# Patient Record
Sex: Female | Born: 1960 | Race: Black or African American | Hispanic: No | Marital: Married | Smoking: Current every day smoker
Health system: Southern US, Community
[De-identification: ages and names within clinical notes are randomized; demographics above are authoritative.]

## PROBLEM LIST (undated history)

## (undated) DIAGNOSIS — D649 Anemia, unspecified: Secondary | ICD-10-CM

## (undated) DIAGNOSIS — E119 Type 2 diabetes mellitus without complications: Secondary | ICD-10-CM

## (undated) DIAGNOSIS — M1990 Unspecified osteoarthritis, unspecified site: Secondary | ICD-10-CM

## (undated) DIAGNOSIS — M797 Fibromyalgia: Secondary | ICD-10-CM

## (undated) DIAGNOSIS — F418 Other specified anxiety disorders: Secondary | ICD-10-CM

## (undated) DIAGNOSIS — M726 Necrotizing fasciitis: Secondary | ICD-10-CM

## (undated) DIAGNOSIS — I10 Essential (primary) hypertension: Secondary | ICD-10-CM

## (undated) HISTORY — PX: OTHER SURGICAL HISTORY: SHX169

---

## 2015-08-17 ENCOUNTER — Encounter (HOSPITAL_COMMUNITY): Payer: Self-pay

## 2015-08-17 ENCOUNTER — Observation Stay (HOSPITAL_COMMUNITY)
Admission: EM | Admit: 2015-08-17 | Discharge: 2015-08-18 | Disposition: A | Discharge status: Home / Self Care | Payer: PRIVATE HEALTH INSURANCE | Attending: Family Medicine | Admitting: Family Medicine

## 2015-08-17 DIAGNOSIS — R0602 Shortness of breath: Secondary | ICD-10-CM | POA: Diagnosis present

## 2015-08-17 DIAGNOSIS — E119 Type 2 diabetes mellitus without complications: Secondary | ICD-10-CM | POA: Insufficient documentation

## 2015-08-17 DIAGNOSIS — N19 Unspecified kidney failure: Secondary | ICD-10-CM | POA: Insufficient documentation

## 2015-08-17 DIAGNOSIS — Z7982 Long term (current) use of aspirin: Secondary | ICD-10-CM | POA: Insufficient documentation

## 2015-08-17 DIAGNOSIS — I10 Essential (primary) hypertension: Secondary | ICD-10-CM | POA: Insufficient documentation

## 2015-08-17 DIAGNOSIS — Z79899 Other long term (current) drug therapy: Secondary | ICD-10-CM | POA: Diagnosis not present

## 2015-08-17 DIAGNOSIS — R4182 Altered mental status, unspecified: Secondary | ICD-10-CM | POA: Diagnosis present

## 2015-08-17 DIAGNOSIS — M65052 Abscess of tendon sheath, left thigh: Secondary | ICD-10-CM | POA: Diagnosis not present

## 2015-08-17 DIAGNOSIS — IMO0002 Reserved for concepts with insufficient information to code with codable children: Secondary | ICD-10-CM

## 2015-08-17 HISTORY — DX: Necrotizing fasciitis: M72.6

## 2015-08-17 HISTORY — DX: Essential (primary) hypertension: I10

## 2015-08-17 HISTORY — DX: Type 2 diabetes mellitus without complications: E11.9

## 2015-08-17 NOTE — ED Triage Notes (Signed)
Pt arrives via [facility] for altered mental status.  Pt apparently was just discharged from [facility] with necrotizing fasciitis.  Pt has an open wound to the inside of her left upper thigh.

## 2015-08-18 ENCOUNTER — Observation Stay (HOSPITAL_COMMUNITY): Payer: PRIVATE HEALTH INSURANCE

## 2015-08-18 ENCOUNTER — Encounter (HOSPITAL_COMMUNITY): Payer: Self-pay

## 2015-08-18 ENCOUNTER — Emergency Department (HOSPITAL_COMMUNITY): Payer: PRIVATE HEALTH INSURANCE

## 2015-08-18 DIAGNOSIS — R0602 Shortness of breath: Secondary | ICD-10-CM | POA: Diagnosis not present

## 2015-08-18 LAB — CBC WITH DIFFERENTIAL/PLATELET
Basophils Absolute: 0.2 10*3/uL — ABNORMAL HIGH (ref 0.0–0.1)
Basophils Relative: 1 %
EOS ABS: 0.3 10*3/uL (ref 0.0–0.7)
Eosinophils Relative: 2 %
HEMATOCRIT: 29.8 % — AB (ref 36.0–46.0)
HEMOGLOBIN: 9.9 g/dL — AB (ref 12.0–15.0)
LYMPHS ABS: 2 10*3/uL (ref 0.7–4.0)
Lymphocytes Relative: 16 %
MCH: 29.6 pg (ref 26.0–34.0)
MCHC: 33.2 g/dL (ref 30.0–36.0)
MCV: 89 fL (ref 78.0–100.0)
MONOS PCT: 9 %
Monocytes Absolute: 1.2 10*3/uL — ABNORMAL HIGH (ref 0.1–1.0)
NEUTROS ABS: 9.1 10*3/uL — AB (ref 1.7–7.7)
NEUTROS PCT: 72 %
Platelets: 367 10*3/uL (ref 150–400)
RBC: 3.35 MIL/uL — ABNORMAL LOW (ref 3.87–5.11)
RDW: 15.4 % (ref 11.5–15.5)
WBC: 12.8 10*3/uL — ABNORMAL HIGH (ref 4.0–10.5)

## 2015-08-18 LAB — URINALYSIS, ROUTINE W REFLEX MICROSCOPIC
BILIRUBIN URINE: NEGATIVE
Glucose, UA: 100 mg/dL — AB
KETONES UR: NEGATIVE mg/dL
LEUKOCYTES UA: NEGATIVE
NITRITE: NEGATIVE
Protein, ur: 30 mg/dL — AB
Specific Gravity, Urine: 1.01 (ref 1.005–1.030)
pH: 7 (ref 5.0–8.0)

## 2015-08-18 LAB — COMPREHENSIVE METABOLIC PANEL
ALK PHOS: 86 U/L (ref 38–126)
ALT: 14 U/L (ref 14–54)
ANION GAP: 9 (ref 5–15)
AST: 17 U/L (ref 15–41)
Albumin: 3 g/dL — ABNORMAL LOW (ref 3.5–5.0)
BILIRUBIN TOTAL: 0.8 mg/dL (ref 0.3–1.2)
BUN: 25 mg/dL — ABNORMAL HIGH (ref 6–20)
CALCIUM: 8.1 mg/dL — AB (ref 8.9–10.3)
CO2: 25 mmol/L (ref 22–32)
Chloride: 109 mmol/L (ref 101–111)
Creatinine, Ser: 3.83 mg/dL — ABNORMAL HIGH (ref 0.44–1.00)
GFR, EST AFRICAN AMERICAN: 15 mL/min — AB (ref >60)
GFR, EST NON AFRICAN AMERICAN: 13 mL/min — AB (ref >60)
Glucose, Bld: 212 mg/dL — ABNORMAL HIGH (ref 65–99)
Potassium: 3 mmol/L — ABNORMAL LOW (ref 3.5–5.1)
Sodium: 143 mmol/L (ref 135–145)
TOTAL PROTEIN: 7 g/dL (ref 6.5–8.1)

## 2015-08-18 LAB — URINE MICROSCOPIC-ADD ON

## 2015-08-18 LAB — BRAIN NATRIURETIC PEPTIDE: B Natriuretic Peptide: 358 pg/mL — ABNORMAL HIGH (ref 0.0–100.0)

## 2015-08-18 LAB — TROPONIN I: Troponin I: <0.03 ng/mL (ref <0.03)

## 2015-08-18 LAB — I-STAT CG4 LACTIC ACID, ED: Lactic Acid, Venous: 1.17 mmol/L (ref 0.5–1.9)

## 2015-08-18 MED ORDER — SODIUM CHLORIDE 0.9 % IV BOLUS (SEPSIS)
1000.0000 mL | Freq: Once | INTRAVENOUS | Status: AC
  Administered 2015-08-18: 1000 mL via INTRAVENOUS

## 2015-08-18 MED ORDER — ACETAMINOPHEN 325 MG PO TABS
650.0000 mg | ORAL_TABLET | Freq: Four times a day (QID) | ORAL | Status: DC | PRN
  Administered 2015-08-18: 650 mg via ORAL
  Filled 2015-08-18: qty 2

## 2015-08-18 MED ORDER — TECHNETIUM TC 99M DIETHYLENETRIAME-PENTAACETIC ACID
30.0000 | Freq: Once | INTRAVENOUS | Status: AC | PRN
  Administered 2015-08-18: 30 via RESPIRATORY_TRACT

## 2015-08-18 MED ORDER — POTASSIUM CHLORIDE CRYS ER 20 MEQ PO TBCR
40.0000 meq | EXTENDED_RELEASE_TABLET | Freq: Once | ORAL | Status: AC
  Administered 2015-08-18: 40 meq via ORAL
  Filled 2015-08-18: qty 2

## 2015-08-18 MED ORDER — PAROXETINE HCL 20 MG PO TABS: 20.0000 mg | ORAL_TABLET | ORAL | Status: DC

## 2015-08-18 MED ORDER — TECHNETIUM TO 99M ALBUMIN AGGREGATED
4.0000 | Freq: Once | INTRAVENOUS | Status: AC | PRN
  Administered 2015-08-18: 4 via INTRAVENOUS

## 2015-08-18 NOTE — Progress Notes (Signed)
Arrived to 330, NAD noted. Patient alert to self and place. vss family at bedside.

## 2015-08-18 NOTE — ED Provider Notes (Signed)
[facility]-EMERGENCY DEPT Provider Note   CSN: [id number] Arrival date & time: 08/17/15  2353  First Provider Contact:   First MD Initiated Contact with Patient 08/18/15 0001    By signing my name below, I, [facility], attest that this documentation has been prepared under the direction and in the presence of Cindy Brooking, MD . Electronically Signed: Levon Ayers, Scribe. 08/18/2015. 12:42 AM.   History   Chief Complaint Chief Complaint  Patient presents with  . Altered Mental Status    HPI Cindy Ayers is a 55 y.o. female brought in by ambulance with PMHx of DM, HTN and necrotizing fasciitis who presents to the Emergency Department complaining of rapidly improving, sudden onset shortness of breath. Pt was given supplemental oxygen on the ambulance PTA with relief. Pt denies any SOB during exam. Pt was discharged from [facility] recently with necrotizing fasciitis and has open wound to inside of left thigh. She has Rx for abx dated 08/16/15 which have not been filled. She denies abdominal pain, chest pain, vomiting, diarrhea, fever, headache. She denies any PMHx of blood clots or SOB.  The history is provided by the patient and medical records. No language interpreter was used.    Past Medical History:  Diagnosis Date  . Diabetes mellitus without complication (HCC)   . Hypertension   . Necrotizing fasciitis [facility])     Patient Active Problem List   Diagnosis Date Noted  . SOB (shortness of breath) 08/18/2015    Past Surgical History:  Procedure Laterality Date  . incision and drainage left thigh Left     OB History    No data available       Home Medications    Prior to Admission medications   Medication Sig Start Date End Date Taking? Authorizing Provider  amLODipine (NORVASC) 10 MG tablet Take 10 mg by mouth daily.   Yes Historical Provider, MD  aspirin EC 81 MG tablet Take 81 mg by mouth daily.   Yes Historical Provider, MD  atorvastatin (LIPITOR) 10 MG  tablet Take 10 mg by mouth daily.   Yes Historical Provider, MD  CALCIUM PO Take 1 tablet by mouth 2 (two) times daily.   Yes Historical Provider, MD  carvedilol (COREG) 25 MG tablet Take 25 mg by mouth 2 (two) times daily with a meal.   Yes Historical Provider, MD  furosemide (LASIX) 20 MG tablet Take 20 mg by mouth.   Yes Historical Provider, MD  HYDROcodone-acetaminophen (NORCO/VICODIN) 5-325 MG tablet Take 1 tablet by mouth every 4 (four) hours as needed for moderate pain.   Yes Historical Provider, MD  levofloxacin (LEVAQUIN) 500 MG tablet Take 500 mg by mouth every other day. 08/16/15 09/03/15 Yes Historical Provider, MD  losartan (COZAAR) 100 MG tablet Take 100 mg by mouth daily.   Yes Historical Provider, MD  metroNIDAZOLE (FLAGYL) 500 MG tablet Take 500 mg by mouth 3 (three) times daily. 08/16/15 08/21/15 Yes Historical Provider, MD  PARoxetine (PAXIL) 20 MG tablet Take 20 mg by mouth daily.   Yes Historical Provider, MD    Family History Family History  Problem Relation Age of Onset  . Diabetes Sister     Social History Social History  Substance Use Topics  . Smoking status: Never Smoker  . Smokeless tobacco: Never Used  . Alcohol use No     Allergies   Antihistamines, chlorpheniramine-type and Ethylenediamine   Review of Systems Review of Systems 10 Systems reviewed and are negative for acute change  except as noted in the HPI.  Physical Exam Updated Vital Signs BP (!) 178/73 (BP Location: Left Arm)   Pulse 71   Temp 98.1 F (36.7 C) (Oral)   Resp 20   Ht  (1.676 m)   Wt 285 lb 4.4 oz (129.4 kg)   SpO2 96%   BMI 46.04 kg/m   Physical Exam  Constitutional: She is oriented to person, place, and time. She appears well-developed and well-nourished. No distress.  Awake and alert.  Denies SOB.  Obese.  HENT:  Head: Normocephalic and atraumatic.  Mouth/Throat: Oropharynx is clear and moist. No oropharyngeal exudate.  Eyes: Conjunctivae and EOM are normal.  Pupils are equal, round, and reactive to light.  Neck: Normal range of motion. Neck supple.  No meningismus.  Cardiovascular: Normal rate, regular rhythm, normal heart sounds and intact distal pulses.   No murmur heard. Pulmonary/Chest: Effort normal and breath sounds normal. No respiratory distress. She has no wheezes. She has no rales.  Lungs are clear. No wheezing.  Abdominal: Soft. There is no tenderness. There is no rebound and no guarding.  Musculoskeletal: Normal range of motion. She exhibits no edema or tenderness.  Neurological: She is alert and oriented to person, place, and time. No cranial nerve deficit. She exhibits normal muscle tone. Coordination normal.   5/5 strength throughout. CN 2-12 intact.Equal grip strength.   Skin: Skin is warm.  Large wound to left medial thigh  Clean based as depicted No bleeding or drainage   Psychiatric: She has a normal mood and affect. Her behavior is normal.  Nursing note and vitals reviewed.      ED Treatments / Results  Labs (all labs ordered are listed, but only abnormal results are displayed) Labs Reviewed  CBC WITH DIFFERENTIAL/PLATELET - Abnormal; Notable for the following:       Result Value   WBC 12.8 (*)    RBC 3.35 (*)    Hemoglobin 9.9 (*)    HCT 29.8 (*)    Neutro Abs 9.1 (*)    Monocytes Absolute 1.2 (*)    Basophils Absolute 0.2 (*)    All other components within normal limits  COMPREHENSIVE METABOLIC PANEL - Abnormal; Notable for the following:    Potassium 3.0 (*)    Glucose, Bld 212 (*)    BUN 25 (*)    Creatinine, Ser 3.83 (*)    Calcium 8.1 (*)    Albumin 3.0 (*)    GFR calc non Af Amer 13 (*)    GFR calc Af Amer 15 (*)    All other components within normal limits  URINALYSIS, ROUTINE W REFLEX MICROSCOPIC (NOT AT [facility]) - Abnormal; Notable for the following:    Glucose, UA 100 (*)    Hgb urine dipstick SMALL (*)    Protein, ur 30 (*)    All other components within normal limits  URINE MICROSCOPIC-ADD  ON - Abnormal; Notable for the following:    Squamous Epithelial / LPF 0-5 (*)    Bacteria, UA FEW (*)    All other components within normal limits  BRAIN NATRIURETIC PEPTIDE - Abnormal; Notable for the following:    B Natriuretic Peptide 358.0 (*)    All other components within normal limits  CULTURE, BLOOD (ROUTINE X 2)  CULTURE, BLOOD (ROUTINE X 2)  URINE CULTURE  TROPONIN I  I-STAT CG4 LACTIC ACID, ED    EKG  EKG Interpretation None       Radiology Ct Head Wo Contrast  Result Date: 08/18/2015 CLINICAL DATA:  55 year old female with altered mental status EXAM: CT HEAD WITHOUT CONTRAST TECHNIQUE: Contiguous axial images were obtained from the base of the skull through the vertex without intravenous contrast. COMPARISON:  None. FINDINGS: The ventricles and sulci appropriate in size for patient's age. Minimal periventricular and deep white matter chronic microvascular ischemic changes noted. There is no acute intracranial hemorrhage. No mass effect or midline shift. The visualized paranasal sinuses and mastoid air cells are clear. The calvarium is intact. IMPRESSION: No acute intracranial pathology. Electronically Signed   By: Elgie Collard M.D.   On: 08/18/2015 04:44   Ct Femur Left Wo Contrast  Result Date: 08/18/2015 CLINICAL DATA:  55 year old female with history of diabetes and necrotizing fasciitis presenting with open wound on the inside of the left thigh. EXAM: CT OF THE LEFT FEMUR WITHOUT CONTRAST TECHNIQUE: Multidetector CT imaging was performed according to the standard protocol. Multiplanar CT image reconstructions were also generated. COMPARISON:  None. FINDINGS: Evaluation of this exam is limited in the absence of intravenous contrast. Evaluation is also very limited due to streak artifact caused by patient's body habitus. No definite acute fracture or dislocation. There is a skin defect and open wound in the superficial soft tissues of the medial aspect of the left thigh  in. No large drainable fluid collection or soft tissue gas identified. IMPRESSION: Very limited study due to patient's body habitus. Open wound in the medial aspect of the left thigh. No definite drainable fluid collection or soft tissue gas. Electronically Signed   By: Elgie Collard M.D.   On: 08/18/2015 05:36   Nm Pulmonary Perf And Vent  Result Date: 08/18/2015 CLINICAL DATA:  Shortness of breath for 1 day EXAM: NUCLEAR MEDICINE VENTILATION - PERFUSION LUNG SCAN Views: Anterior, posterior, left lateral, right lateral, RPO, LPO, RAO, LAO -ventilation and perfusion RADIOPHARMACEUTICALS:  30.0 mCi Technetium-1m DTPA aerosol inhalation and 4.9 mCi Technetium-29m MAA IV COMPARISON:  Chest radiograph August 18, 2015 FINDINGS: Ventilation: Radiotracer uptake is homogeneous and symmetric bilaterally. No ventilation defects are evident. Perfusion: Radiotracer uptake is homogeneous and symmetric bilaterally. No perfusion defects are evident. IMPRESSION: Normal study without appreciable ventilation or perfusion defects. Electronically Signed   By: Bretta Bang III M.D.   On: 08/18/2015 09:57   Dg Chest Portable 1 View  Result Date: 08/18/2015 CLINICAL DATA:  Altered mental status. Shortness of breath tonight. Patient recently discharged from an outside facility with necrotizing fasciitis. EXAM: PORTABLE CHEST 1 VIEW COMPARISON:  None. FINDINGS: Mild elevation of right hemidiaphragm. Mild enlargement of the cardiac silhouette. There is aortic atherosclerosis. Probable vascular congestion without evidence of pulmonary edema. Bibasilar atelectasis. No focal airspace disease. No large pleural effusion or pneumothorax. Evaluation limited by portable technique and large body habitus. IMPRESSION: 1. Cardiomegaly with aortic atherosclerosis. Probable vascular congestion. 2. Elevated right hemidiaphragm.  Bibasilar atelectasis. Electronically Signed   By: Rubye Oaks M.D.   On: 08/18/2015 00:32     Procedures Procedures (including critical care time)  Medications Ordered in ED Medications  acetaminophen (TYLENOL) tablet 650 mg (650 mg Oral Given 08/18/15 1419)  potassium chloride SA (K-DUR,KLOR-CON) CR tablet 40 mEq (not administered)  sodium chloride 0.9 % bolus 1,000 mL (0 mLs Intravenous Stopped 08/18/15 0638)  technetium TC 56M diethylenetriame-pentaacetic acid (DTPA) injection 30 millicurie (30 millicuries Inhalation Given 08/18/15 0900)  technetium albumin aggregated (MAA) injection solution 4 millicurie (4 millicuries Intravenous Contrast Given 08/18/15 0920)     Initial Impression / Assessment and Plan /  ED Course  I have reviewed the triage vital signs and the nursing notes.  Pertinent labs & imaging results that were available during my care of the patient were reviewed by me and considered in my medical decision making (see chart for details).  Clinical Course  Patient from home with shortness of breath that has since resolved. Discharged from the hospital today after apparent stay for necrotizing fasciitis of her left thigh. Denies fever, chills, nausea or vomiting. She is alert and oriented 3 and in no distress on arrival. Denies chest pain or shortness of breath.  Labs show anemia with no comparison. An elevated creatinine 3.8 with no comparison.  Records received from [place]. Patient was taken to the operating room in July 15 for incision and drainage of her left upper thigh abscess and necrotizing fasciitis. She also went to the OR on July 17. And on July 19. She was intubated due to her sepsis. She was also on dialysis while she was hospitalized and found to have depressive disorder and was refusing care.  Patient's husband has arrived. Contrary to triage note he denies any altered mental status. States patient was complaining of shortness of breath only. States she feels better now. No fever. They're concerned because there were not given any wound care supplies for  her leg. There is no tachycardia and no hypoxia. Patient denies any chest pain or shortness of breath currently. Chest x-rays negative. Patient's creatinine precludes CT angiogram but there is some concern for possible pulmonary embolism given her recent hospitalization and surgeries.  Plan observation to obtain VQ scan in the morning.  Patient states she feels back to baseline. No CP or SOB. CXR shows cardiomegaly. BNP minimally elevated. Troponin negative. No tachycardia or hypoxia.  Creatinine elevated but unclear what discharge creatinine was.  D/w Dr. Onalee Hua and Dr. Irene Limbo.  Final Clinical Impressions(s) / ED Diagnoses   Final diagnoses:  Wound abscess  Shortness of breath  Renal failure   I personally performed the services described in this documentation, which was scribed in my presence. The recorded information has been reviewed and is accurate.   New Prescriptions Current Discharge Medication List       Glynn Octave, MD 08/18/15 [phone]

## 2015-08-18 NOTE — Progress Notes (Signed)
Inpatient Diabetes Program Recommendations  AACE/ADA: New Consensus Statement on Inpatient Glycemic Control (2015)  Target Ranges:  Prepandial:   less than 140 mg/dL      Peak postprandial:   less than 180 mg/dL (1-2 hours)      Critically ill patients:  140 - 180 mg/dL   Results for [facility], [facility] (MRN [id number]) as of 08/18/2015 09:09  Ref. Range 08/18/2015 00:20  Glucose Latest Ref Range: 65 - 99 mg/dL 427 (H)   Review of Glycemic Control  Diabetes history: DM2 Outpatient Diabetes medications: None listed on home medication list Current orders for Inpatient glycemic control: None  Inpatient Diabetes Program Recommendations: Correction (SSI): While inpatient, please order CBGs with Novolog correction scale ACHS. HgbA1C: Please order an A1C to evaluate glycemic control over the past 2-3 months. Diet: When diet ordered, please order Carb Modified diet.  Thanks, Orlando Penner, RN, MSN, CDE Diabetes Coordinator Inpatient Diabetes Program [phone] (Team Pager from 8am to 5pm) [phone] ([facility] office) [phone] [facility] office) [phone] [facility] office)

## 2015-08-18 NOTE — Care Management Note (Signed)
Case Management Note  Patient Details  Name: Cindy Ayers MRN: [id number] Date of Birth: 08/14/1960  Subjective/Objective: Patient is from home, ind with ADL's. Recently discharged from [facility], apparently has no home health services in place. Will need HHRN/PT.              Action/Plan: Will DC today with HH. [facility] notified and will obtain orders from chart. Patient and husband aware [facility] has 48 hours to make first visit.   Expected Discharge Date:  08/20/15               Expected Discharge Plan:  Home w Home Health Services  In-House Referral:  NA  Discharge planning Services  CM Consult  Post Acute Care Choice:  NA Choice offered to:  NA  DME Arranged:    DME Agency:     HH Arranged:    HH Agency:     Status of Service:  Completed, signed off  If discussed at [employer] of Stay Meetings, dates discussed:    Additional Comments:  Edris Schneck, Chrystine Oiler, RN 08/18/2015, 1:13 PM

## 2015-08-18 NOTE — Progress Notes (Signed)
Pt discharged home today per Dr. Irene Limbo.  Pt's IV site D/C'd and WDL.  Pt's VSS.  Pt provided with home medication list and discharge instructions.  Verbalized understanding.  Husband return demonstrated how to empty foley catheter.  Pt left floor via WC in stable condition accompanied by NT.

## 2015-08-18 NOTE — Discharge Summary (Signed)
Physician Discharge Summary  Cindy Ayers ZOX:[id number] DOB: 11-10-1960 DOA: 08/17/2015  PCP: [facility]  Admit date: 08/17/2015 Discharge date: 08/18/2015  Recommendations for Outpatient Follow-up:  1. Follow up with PCP, General surgery and nephrology as previously arranged by discharging physician at [facility] 2. Home health has been arranged for wound and foley care  Discharge Diagnoses:  1. SOB 2. AKI  3. Hypokalemia 4. Anemia of critical illness 5. Necrotizing fasciitis on previous admission 6. DM type 2 7. Morbid obesity  Discharge Condition: improved Disposition: discharge home with [facility]  Diet recommendation: carb modified  Filed Weights   08/18/15 0216 08/18/15 0703  Weight: 124.7 kg (275 lb) 129.4 kg (285 lb 4.4 oz)    History of present illness:  55 year old woman discharged from [facility] August 1 after 2 week hospitalization for necrotizing fasciitis of the left thigh requiring multiple debridement, ventilatory dependent respiratory failure, sepsis, acute kidney injury requiring temporary dialysis who presented to the emergency department at [facility] 8/2 with shortness of breath. Initial evaluation was unrevealing given recent hospitalization there was concern for PE, observation was requested until such time as a VQ scan could be obtained.  Hospital Course:  Patient was observed in the hospital. Subsequent VQ scan was normal. She's had no recurrent shortness of breath. No hypoxia. No leukocytosis. Chest x-ray no acute disease. Troponin was negative. EKG nonacute. Etiology of shortness of breath is unclear but there is no evidence of infection or life-threatening issue.Consider anxiety given known depression. Review of records demonstrated improving renal function and hemoglobin. She's had no concerning features signs or symptoms during her observation. Home health was arranged for proper wound care and follow-up in the outpatient  setting. Foley catheter was placed in the emergency department. Husband does report patient has been using a bedpan and has difficulty keeping wound clean. Wound has been contaminant by urine at home. Therefore the patient and the husband elect to keep Foley catheter for now to aid in wound care. They understand the benefits and risks including infection. Individual issues as below.  1. SOB. Spontaneously resolved. No chest pain. Well's score 1.5 (low risk, recent surgery). CXR and CT head negative. BNP with trivial elevation. VQ scan was normal. 2. AKI. Improving based on review of records from hospitalization in [place], specifically on discharge summary where BUN was noted to be 30 and creatinine was noted to be 4.4 on discharge. She has outpatient follow-up with her physicians in [place]. 3. Hypokalemia. Repleted. 4. Anemia of critical illness, improving compared to discharge hemoglobin value of 8.9 5. Necrotizing fasciitis s/p OR 7/15 and 7/17 [facility] with open wound left thigh d/c from [facility] 8/2. Hospitalization complicated by acute dialysis, depressive disorder refusing care. CT femur 8/3 at [facility]: no gas or fluid collection. 6. RN documented AMS but EDP reported no AMS. No altered mental status per family. 7. HTN.. Stable. 8. DM. Blood sugars stable. 9. Morbid obesity  Discharge Instructions  Discharge Instructions    Diet Carb Modified    Complete by:  As directed   Discharge instructions    Complete by:  As directed   Call your physician or seek immediate medical attention for fever, pain, wound drainage, rash, shortness of breath or worsening of condition.   Increase activity slowly    Complete by:  As directed       Medication List    STOP taking these medications   losartan 100 MG tablet Commonly known as:  COZAAR     TAKE these medications   amLODipine 10 MG tablet Commonly known as:  NORVASC Take 10 mg by mouth daily.   aspirin EC 81 MG  tablet Take 81 mg by mouth daily.   atorvastatin 10 MG tablet Commonly known as:  LIPITOR Take 10 mg by mouth daily.   CALCIUM PO Take 1 tablet by mouth 2 (two) times daily.   carvedilol 25 MG tablet Commonly known as:  COREG Take 25 mg by mouth 2 (two) times daily with a meal.   furosemide 20 MG tablet Commonly known as:  LASIX Take 20 mg by mouth.   HYDROcodone-acetaminophen 5-325 MG tablet Commonly known as:  NORCO/VICODIN Take 1 tablet by mouth every 4 (four) hours as needed for moderate pain.   levofloxacin 500 MG tablet Commonly known as:  LEVAQUIN Take 500 mg by mouth every other day.   metroNIDAZOLE 500 MG tablet Commonly known as:  FLAGYL Take 500 mg by mouth 3 (three) times daily.   PARoxetine 20 MG tablet Commonly known as:  PAXIL Take 20 mg by mouth daily. What changed:  Another medication with the same name was removed. Continue taking this medication, and follow the directions you see here.      Allergies  Allergen Reactions  . Antihistamines, Chlorpheniramine-Type   . Ethylenediamine     The results of significant diagnostics from this hospitalization (including imaging, microbiology, ancillary and laboratory) are listed below for reference.    Significant Diagnostic Studies: Ct Head Wo Contrast  Result Date: 08/18/2015 CLINICAL DATA:  55 year old female with altered mental status EXAM: CT HEAD WITHOUT CONTRAST TECHNIQUE: Contiguous axial images were obtained from the base of the skull through the vertex without intravenous contrast. COMPARISON:  None. FINDINGS: The ventricles and sulci appropriate in size for patient's age. Minimal periventricular and deep white matter chronic microvascular ischemic changes noted. There is no acute intracranial hemorrhage. No mass effect or midline shift. The visualized paranasal sinuses and mastoid air cells are clear. The calvarium is intact. IMPRESSION: No acute intracranial pathology. Electronically Signed   By:  Elgie Collard M.D.   On: 08/18/2015 04:44   Ct Femur Left Wo Contrast  Result Date: 08/18/2015 CLINICAL DATA:  55 year old female with history of diabetes and necrotizing fasciitis presenting with open wound on the inside of the left thigh. EXAM: CT OF THE LEFT FEMUR WITHOUT CONTRAST TECHNIQUE: Multidetector CT imaging was performed according to the standard protocol. Multiplanar CT image reconstructions were also generated. COMPARISON:  None. FINDINGS: Evaluation of this exam is limited in the absence of intravenous contrast. Evaluation is also very limited due to streak artifact caused by patient's body habitus. No definite acute fracture or dislocation. There is a skin defect and open wound in the superficial soft tissues of the medial aspect of the left thigh in. No large drainable fluid collection or soft tissue gas identified. IMPRESSION: Very limited study due to patient's body habitus. Open wound in the medial aspect of the left thigh. No definite drainable fluid collection or soft tissue gas. Electronically Signed   By: Elgie Collard M.D.   On: 08/18/2015 05:36   Nm Pulmonary Perf And Vent  Result Date: 08/18/2015 CLINICAL DATA:  Shortness of breath for 1 day EXAM: NUCLEAR MEDICINE VENTILATION - PERFUSION LUNG SCAN Views: Anterior, posterior, left lateral, right lateral, RPO, LPO, RAO, LAO -ventilation and perfusion RADIOPHARMACEUTICALS:  30.0 mCi Technetium-70m DTPA aerosol inhalation and 4.9 mCi Technetium-30m MAA IV COMPARISON:  Chest radiograph August 18, 2015 FINDINGS: Ventilation: Radiotracer uptake is homogeneous and symmetric bilaterally. No ventilation defects are evident. Perfusion: Radiotracer uptake is homogeneous and symmetric bilaterally. No perfusion defects are evident. IMPRESSION: Normal study without appreciable ventilation or perfusion defects. Electronically Signed   By: Bretta Bang III M.D.   On: 08/18/2015 09:57   Dg Chest Portable 1 View  Result Date:  08/18/2015 CLINICAL DATA:  Altered mental status. Shortness of breath tonight. Patient recently discharged from an outside facility with necrotizing fasciitis. EXAM: PORTABLE CHEST 1 VIEW COMPARISON:  None. FINDINGS: Mild elevation of right hemidiaphragm. Mild enlargement of the cardiac silhouette. There is aortic atherosclerosis. Probable vascular congestion without evidence of pulmonary edema. Bibasilar atelectasis. No focal airspace disease. No large pleural effusion or pneumothorax. Evaluation limited by portable technique and large body habitus. IMPRESSION: 1. Cardiomegaly with aortic atherosclerosis. Probable vascular congestion. 2. Elevated right hemidiaphragm.  Bibasilar atelectasis. Electronically Signed   By: Rubye Oaks M.D.   On: 08/18/2015 00:32    Microbiology: Recent Results (from the past 240 hour(s))  Blood culture (routine x 2)     Status: None (Preliminary result)   Collection Time: 08/18/15 12:20 AM  Result Value Ref Range Status   Specimen Description BLOOD LEFT ANTECUBITAL  Final   Special Requests BOTTLES DRAWN AEROBIC ONLY 3CC  Final   Culture NO GROWTH < 12 HOURS  Final   Report Status PENDING  Incomplete  Blood culture (routine x 2)     Status: None (Preliminary result)   Collection Time: 08/18/15 12:40 AM  Result Value Ref Range Status   Specimen Description BLOOD RIGHT HAND  Final   Special Requests   Final    BOTTLES DRAWN AEROBIC AND ANAEROBIC AEB 6CC ANA 4CC   Culture NO GROWTH < 12 HOURS  Final   Report Status PENDING  Incomplete     Labs: Basic Metabolic Panel:  Recent Labs Lab 08/18/15 0020  NA 143  K 3.0*  CL 109  CO2 25  GLUCOSE 212*  BUN 25*  CREATININE 3.83*  CALCIUM 8.1*   Liver Function Tests:  Recent Labs Lab 08/18/15 0020  AST 17  ALT 14  ALKPHOS 86  BILITOT 0.8  PROT 7.0  ALBUMIN 3.0*   CBC:  Recent Labs Lab 08/18/15 0020  WBC 12.8*  NEUTROABS 9.1*  HGB 9.9*  HCT 29.8*  MCV 89.0  PLT 367   Cardiac  Enzymes:  Recent Labs Lab 08/18/15 0942  TROPONINI <0.03     Recent Labs  08/18/15 0942  BNP 358.0*     Active Problems:   SOB (shortness of breath)   Time coordinating discharge: 55 minutes  Signed:  Brendia Sacks, MD [facility] 08/18/2015, 4:25 PM  By signing my name below, I, Adron Bene, attest that this documentation has been prepared under the direction and in the presence of Jisselle Poth P. Irene Limbo, MD. Electronically Signed: Adron Bene, Scribe.  08/18/15 12:00pm  I personally performed the services described in this documentation. All medical record entries made by the scribe were at my direction. I have reviewed the chart and agree that the record reflects my personal performance and is accurate and complete. Brendia Sacks, MD

## 2015-08-18 NOTE — H&P (Addendum)
History and Physical  Cindy Ayers ZOX:[id number] DOB: May 24, 1960 DOA: 08/17/2015  PCP: [facility]  Patient coming from: home  Chief Complaint: SOB  HPI:  55 year old woman with a hx of DM, HTN, and necrotizing fasciitis 07/2015 with discharged from [facility] 8/1 who presented with sudden onset of shortness of breath. Initial evaluation was unremarkable but given concern of recent hospitalization, observation was recommended to further evaluate obtain VQ scan.  She reports feeling fairly well when she went home. Last evening 8/2 she developed sudden shortness of breath. She reports that her symptoms were alleviated by sitting up. She described no aggravating factors. . She denied any nausea, vomiting, or diaphoresis. She reports that her breathing began to rapidly improve with supplemental oxygen given by EMS. By the time she arrived in the ED, she felt as though her breathing had returned to normal.   Patient was recently discharged from [facility] after being treated for necrotizing fasciitis. She was hospitalized for over two weeks, requiring two debridement surgeries, dialysis, and was discharged home with [facility].    While being evaluated in the ED, patient was noted to be in no acute distress with stable vital signs. Bloodwork revealed hypokalemia, elevated Cr, hyperglycemia, and leukocytosis. Her lactic acid was noted to be within normal limits. UA negative. Blood and urine cultures were collected and sent for further testing. CT femur showed an open wound on left thigh, but no definite drainable area. CT head was non-acute. Patient was referred for admission for further evaluation and management.  ED Course: afebrile, VSS, no hypoxia   Pertinent labs: BMP, CBC, lactic acid, UA, blood and urine cultures. Imaging: independently reviewed. CT head negative. CT femur no apparent complications. CXR vascular congestion.  Review of Systems:  Negative for  fever, visual changes, sore throat, rash, new muscle aches, chest pain, SOB, dysuria, bleeding, n/v/abdominal pain.  Past Medical History:  Diagnosis Date  . Diabetes mellitus without complication (HCC)   . Hypertension   . Necrotizing fasciitis [facility])     Past Surgical History:  Procedure Laterality Date  . incision and drainage left thigh Left      reports that she has never smoked. She has never used smokeless tobacco. She reports that she does not drink alcohol or use drugs. Ambulatory status: ambulatory.   Allergies  Allergen Reactions  . Antihistamines, Chlorpheniramine-Type   . Ethylenediamine     Family History  Problem Relation Age of Onset  . Diabetes Sister      Prior to Admission medications   Not on File    Physical Exam: Vitals:   08/18/15 0311 08/18/15 0330 08/18/15 0630 08/18/15 0703  BP: 181/83 177/79 178/84 (!) 165/78  Pulse: 90  72 78  Resp: 25 19 25  (!) 21  Temp:    98.7 F (37.1 C)  TempSrc:    Oral  SpO2: 96%  96% 98%  Weight:    129.4 kg (285 lb 4.4 oz)  Height:    5\' 6"  (1.676 m)   Constitutional:  . Appears calm and comfortable Eyes:  . PERRL and irises appear normal . Normal conjunctivae and lids ENMT:  . external ears, nose appear normal . grossly normal hearing . white exudate on buccal mucosa and tongue. Respiratory:  . CTA bilaterally, no w/r/r.  . Respiratory effort normal. No retractions or accessory muscle use Cardiovascular:  . RRR, no m/r/g . 1+ LE extremity edema left greater than right.   Abdomen:  .  Abdomen appears normal; no tenderness or masses. nondistended Musculoskeletal:  . Moves all extremities Skin:  . Grossly unremarkable except as documented by EDP Neurologic:  . Grossly normal Psychiatric:  . judgement and insight appear normal . Mental status o Mood, affect appropriate  Wt Readings from Last 3 Encounters:  08/18/15 129.4 kg (285 lb 4.4 oz)    I have personally reviewed following labs and  imaging studies  Labs on Admission:  CBC:  Recent Labs Lab 08/18/15 0020  WBC 12.8*  NEUTROABS 9.1*  HGB 9.9*  HCT 29.8*  MCV 89.0  PLT 367   Basic Metabolic Panel:  Recent Labs Lab 08/18/15 0020  NA 143  K 3.0*  CL 109  CO2 25  GLUCOSE 212*  BUN 25*  CREATININE 3.83*  CALCIUM 8.1*   Liver Function Tests:  Recent Labs Lab 08/18/15 0020  AST 17  ALT 14  ALKPHOS 86  BILITOT 0.8  PROT 7.0  ALBUMIN 3.0*   Urine analysis:    Component Value Date/Time   COLORURINE YELLOW 08/18/2015 0305   APPEARANCEUR CLEAR 08/18/2015 0305   LABSPEC 1.010 08/18/2015 0305   PHURINE 7.0 08/18/2015 0305   GLUCOSEU 100 (A) 08/18/2015 0305   HGBUR SMALL (A) 08/18/2015 0305   BILIRUBINUR NEGATIVE 08/18/2015 0305   KETONESUR NEGATIVE 08/18/2015 0305   PROTEINUR 30 (A) 08/18/2015 0305   NITRITE NEGATIVE 08/18/2015 0305   LEUKOCYTESUR NEGATIVE 08/18/2015 0305    Recent Results (from the past 240 hour(s))  Blood culture (routine x 2)     Status: None (Preliminary result)   Collection Time: 08/18/15 12:20 AM  Result Value Ref Range Status   Specimen Description BLOOD LEFT ANTECUBITAL  Final   Special Requests BOTTLES DRAWN AEROBIC ONLY 3CC  Final   Culture NO GROWTH < 12 HOURS  Final   Report Status PENDING  Incomplete  Blood culture (routine x 2)     Status: None (Preliminary result)   Collection Time: 08/18/15 12:40 AM  Result Value Ref Range Status   Specimen Description BLOOD RIGHT HAND  Final   Special Requests   Final    BOTTLES DRAWN AEROBIC AND ANAEROBIC AEB 6CC ANA 4CC   Culture NO GROWTH < 12 HOURS  Final   Report Status PENDING  Incomplete      Radiological Exams on Admission: Ct Head Wo Contrast  Result Date: 08/18/2015 CLINICAL DATA:  55 year old female with altered mental status EXAM: CT HEAD WITHOUT CONTRAST TECHNIQUE: Contiguous axial images were obtained from the base of the skull through the vertex without intravenous contrast. COMPARISON:  None.  FINDINGS: The ventricles and sulci appropriate in size for patient's age. Minimal periventricular and deep white matter chronic microvascular ischemic changes noted. There is no acute intracranial hemorrhage. No mass effect or midline shift. The visualized paranasal sinuses and mastoid air cells are clear. The calvarium is intact. IMPRESSION: No acute intracranial pathology. Electronically Signed   By: Elgie Collard M.D.   On: 08/18/2015 04:44   Ct Femur Left Wo Contrast  Result Date: 08/18/2015 CLINICAL DATA:  55 year old female with history of diabetes and necrotizing fasciitis presenting with open wound on the inside of the left thigh. EXAM: CT OF THE LEFT FEMUR WITHOUT CONTRAST TECHNIQUE: Multidetector CT imaging was performed according to the standard protocol. Multiplanar CT image reconstructions were also generated. COMPARISON:  None. FINDINGS: Evaluation of this exam is limited in the absence of intravenous contrast. Evaluation is also very limited due to streak artifact caused by patient's body  habitus. No definite acute fracture or dislocation. There is a skin defect and open wound in the superficial soft tissues of the medial aspect of the left thigh in. No large drainable fluid collection or soft tissue gas identified. IMPRESSION: Very limited study due to patient's body habitus. Open wound in the medial aspect of the left thigh. No definite drainable fluid collection or soft tissue gas. Electronically Signed   By: Elgie Collard M.D.   On: 08/18/2015 05:36   Nm Pulmonary Perf And Vent  Result Date: 08/18/2015 CLINICAL DATA:  Shortness of breath for 1 day EXAM: NUCLEAR MEDICINE VENTILATION - PERFUSION LUNG SCAN Views: Anterior, posterior, left lateral, right lateral, RPO, LPO, RAO, LAO -ventilation and perfusion RADIOPHARMACEUTICALS:  30.0 mCi Technetium-43m DTPA aerosol inhalation and 4.9 mCi Technetium-7m MAA IV COMPARISON:  Chest radiograph August 18, 2015 FINDINGS: Ventilation: Radiotracer  uptake is homogeneous and symmetric bilaterally. No ventilation defects are evident. Perfusion: Radiotracer uptake is homogeneous and symmetric bilaterally. No perfusion defects are evident. IMPRESSION: Normal study without appreciable ventilation or perfusion defects. Electronically Signed   By: Bretta Bang III M.D.   On: 08/18/2015 09:57   Dg Chest Portable 1 View  Result Date: 08/18/2015 CLINICAL DATA:  Altered mental status. Shortness of breath tonight. Patient recently discharged from an outside facility with necrotizing fasciitis. EXAM: PORTABLE CHEST 1 VIEW COMPARISON:  None. FINDINGS: Mild elevation of right hemidiaphragm. Mild enlargement of the cardiac silhouette. There is aortic atherosclerosis. Probable vascular congestion without evidence of pulmonary edema. Bibasilar atelectasis. No focal airspace disease. No large pleural effusion or pneumothorax. Evaluation limited by portable technique and large body habitus. IMPRESSION: 1. Cardiomegaly with aortic atherosclerosis. Probable vascular congestion. 2. Elevated right hemidiaphragm.  Bibasilar atelectasis. Electronically Signed   By: Rubye Oaks M.D.   On: 08/18/2015 00:32   Active Problems:   SOB (shortness of breath)   Assessment/Plan 1. SOB. Spontaneously resolved. No chest pain. Well's score 1.5 (low risk, recent surgery). CXR and CT head negative. BNP with trivial elevation. 2. AKI. Discharge value pending. 3. Hypokalemia.  4. Anemia of critical illness, suspected baseline. 5. Necrotizing fasciitis s/p OR 7/15 and 7/17 [facility] with open wound left thigh d/c from [facility] 8/2. Hospitalization complicated by acute dialysis, depressive disorder refusing care. CT femur 8/3 at [facility]: no gas or fluid collection. 6. RN documented AMS but EDP reported no AMS. No altered mental status per family. 7. HTN.. Stable. 8. DM. Blood sugars stable. 9. Morbid obesity   Admit as obs to medical bed.  Obtain medical  records not present at initial time of evaluation to ascertain discharge creatinine and hemoglobin.  She will need to follow up with [facility] surgeon and nephrologist.  Will ensure patient has home health on discharge  If VQ scan is negative would anticipate discharge later today.   DVT prophylaxis: SCDs Code Status: Full Family Communication: Discussed with patient and family present at bedside Disposition Plan: Admit as observation to medical floor. Discharge home once improved   Consults called: none   Admission status: admit as observation to medical floor.    Time spent: 55 minutes  Brendia Sacks, MD  [facility] Direct contact: [phone] --Via amion app OR  --[email or web address]; password [email or web address]  7PM-7AM contact night coverage as above  08/18/2015, 2:29 PM  By signing my name below, I, Adron Bene, attest that this documentation has been prepared under the direction and in the presence of Daniel P. Irene Limbo, MD. Electronically Signed: Adron Bene, Scribe.  08/18/15  9:00am  I personally performed the services described in this documentation. All medical record entries made by the scribe were at my direction. I have reviewed the chart and agree that the record reflects my personal performance and is accurate and complete. Brendia Sacks, MD

## 2015-08-19 LAB — URINE CULTURE: CULTURE: NO GROWTH

## 2015-08-23 LAB — CULTURE, BLOOD (ROUTINE X 2)
CULTURE: NO GROWTH
Culture: NO GROWTH

## 2020-11-04 ENCOUNTER — Other Ambulatory Visit (HOSPITAL_COMMUNITY): Payer: Self-pay | Admitting: Orthopedic Surgery

## 2020-12-30 NOTE — Progress Notes (Signed)
Your procedure is scheduled on Thursday 01/05/21.  Report to [facility] Main Entrance "A" at 05:30 A.M., and check in at the Admitting office.  Call this number if you have problems the morning of surgery: [phone]  Call [phone] if you have any questions prior to your surgery date Monday-Friday 8am-4pm   Remember: Do not eat after midnight the night before your surgery  You may drink clear liquids until 04:30 AM the morning of your surgery.   Clear liquids allowed are: Water, Non-Citrus Juices (without pulp), Carbonated Beverages, Clear Tea, Black Coffee Only, and Gatorade  Please complete your PRE-SURGERY G2 that was provided to you by 04:30 AM the morning of your surgery.   Please, if able, drink it in one setting. DO NOT SIP.    Take these medicines the morning of surgery with A SIP OF WATER: carvedilol (COREG)  PARoxetine (PAXIL)   traMADol (ULTRAM) - if needed  Follow your surgeon's instructions on when to stop Aspirin.  If no instructions were given by your surgeon then you will need to call the office to get those instructions.    As of today, STOP taking any meloxicam (MOBIC), Aleve, Naproxen, Ibuprofen, Motrin, Advil, Goody's, BC's, all herbal medications, fish oil, and all vitamins.    The Morning of Surgery  Do not wear jewelry, make-up or nail polish.  Do not wear lotions, powders, or perfumes, or deodorant  Do not shave 48 hours prior to surgery.     Do not bring valuables to the hospital.  [facility] is not responsible for any belongings or valuables.  If you are a smoker, DO NOT Smoke 24 hours prior to surgery  If you wear a CPAP at night please bring your mask the morning of surgery   Remember that you must have someone to transport you home after your surgery, and remain with you for 24 hours if you are discharged the same day.   Please bring cases for contacts, glasses, hearing aids, dentures or bridgework because it cannot be worn into  surgery.    Leave your suitcase in the car.  After surgery it may be brought to your room.  For patients admitted to the hospital, discharge time will be determined by your treatment team.  Patients discharged the day of surgery will not be allowed to drive home.    Special instructions:   [place]- Preparing For Surgery  Before surgery, you can play an important role. Because skin is not sterile, your skin needs to be as free of germs as possible. You can reduce the number of germs on your skin by washing with CHG (chlorahexidine gluconate) Soap before surgery.  CHG is an antiseptic cleaner which kills germs and bonds with the skin to continue killing germs even after washing.    Oral Hygiene is also important to reduce your risk of infection.  Remember - BRUSH YOUR TEETH THE MORNING OF SURGERY WITH YOUR REGULAR TOOTHPASTE  Please do not use if you have an allergy to CHG or antibacterial soaps. If your skin becomes reddened/irritated stop using the CHG.  Do not shave (including legs and underarms) for at least 48 hours prior to first CHG shower. It is OK to shave your face.  Please follow these instructions carefully.   Shower the NIGHT BEFORE SURGERY and the MORNING OF SURGERY with CHG Soap.   If you chose to wash your hair and body, wash as usual with your normal shampoo and body-wash/soap.  Rinse  your hair and body thoroughly to remove the shampoo and soap.  Apply CHG directly to the skin (ONLY FROM THE NECK DOWN) and wash gently with a scrungie or a clean washcloth.   Do not use on open wounds or open sores. Avoid contact with your eyes, ears, mouth and genitals (private parts). Wash Face and genitals (private parts)  with your normal soap.   Wash thoroughly, paying special attention to the area where your surgery will be performed.  Thoroughly rinse your body with warm water from the neck down.  DO NOT shower/wash with your normal soap after using and rinsing off the CHG  Soap.  Pat yourself dry with a CLEAN TOWEL.  Wear CLEAN PAJAMAS to bed the night before surgery  Place CLEAN SHEETS on your bed the night of your first shower and DO NOT SLEEP WITH PETS.  Wear comfortable clothes the morning of surgery.     Day of Surgery:  Please shower the morning of surgery with the CHG soap Do not apply any deodorants/lotions. Please wear clean clothes to the hospital/surgery center.   Remember to brush your teeth WITH YOUR REGULAR TOOTHPASTE.  NO VISITORS WILL BE ALLOWED IN PRE-OP WHERE PATIENTS ARE PREPPED FOR SURGERY.  ONLY 1 SUPPORT PERSON MAY BE PRESENT IN THE WAITING ROOM WHILE YOU ARE IN SURGERY.  IF YOU ARE TO BE ADMITTED, ONCE YOU ARE IN YOUR ROOM YOU WILL BE ALLOWED TWO (2) VISITORS. 1 (ONE) VISITOR MAY STAY OVERNIGHT BUT MUST ARRIVE TO THE ROOM BY 8pm.  Minor children may have two parents present. Special consideration for safety and communication needs will be reviewed on a case by case basis.     Please read over the following fact sheets that you were given.

## 2021-01-02 ENCOUNTER — Inpatient Hospital Stay (HOSPITAL_COMMUNITY): Admission: RE | Admit: 2021-01-02 | Payer: PRIVATE HEALTH INSURANCE | Source: Ambulatory Visit

## 2021-01-03 ENCOUNTER — Other Ambulatory Visit (HOSPITAL_COMMUNITY): Payer: BC Managed Care – PPO

## 2021-01-04 ENCOUNTER — Other Ambulatory Visit: Payer: Self-pay

## 2021-01-04 ENCOUNTER — Encounter (HOSPITAL_COMMUNITY): Payer: Self-pay

## 2021-01-04 ENCOUNTER — Encounter (HOSPITAL_COMMUNITY)
Admission: RE | Admit: 2021-01-04 | Discharge: 2021-01-04 | Disposition: A | Discharge status: Home / Self Care | Payer: BC Managed Care – PPO | Source: Ambulatory Visit | Attending: Orthopedic Surgery | Admitting: Orthopedic Surgery

## 2021-01-04 VITALS — BP 157/81 | HR 69 | Temp 98.8°F | Resp 18 | Ht 66.0 in | Wt 317.9 lb

## 2021-01-04 DIAGNOSIS — Z20822 Contact with and (suspected) exposure to covid-19: Secondary | ICD-10-CM | POA: Insufficient documentation

## 2021-01-04 DIAGNOSIS — Z01818 Encounter for other preprocedural examination: Secondary | ICD-10-CM | POA: Diagnosis not present

## 2021-01-04 HISTORY — DX: Unspecified osteoarthritis, unspecified site: M19.90

## 2021-01-04 HISTORY — DX: Fibromyalgia: M79.7

## 2021-01-04 HISTORY — DX: Anemia, unspecified: D64.9

## 2021-01-04 LAB — SURGICAL PCR SCREEN
MRSA, PCR: NEGATIVE
Staphylococcus aureus: POSITIVE — AB

## 2021-01-04 LAB — CBC
HCT: 38.9 % (ref 36.0–46.0)
Hemoglobin: 12 g/dL (ref 12.0–15.0)
MCH: 28.5 pg (ref 26.0–34.0)
MCHC: 30.8 g/dL (ref 30.0–36.0)
MCV: 92.4 fL (ref 80.0–100.0)
Platelets: 240 10*3/uL (ref 150–400)
RBC: 4.21 MIL/uL (ref 3.87–5.11)
RDW: 15 % (ref 11.5–15.5)
WBC: 10 10*3/uL (ref 4.0–10.5)
nRBC: 0 % (ref 0.0–0.2)

## 2021-01-04 LAB — HEMOGLOBIN A1C
Hgb A1c MFr Bld: 7.3 % — ABNORMAL HIGH (ref 4.8–5.6)
Mean Plasma Glucose: 162.81 mg/dL

## 2021-01-04 LAB — BASIC METABOLIC PANEL
Anion gap: 6 (ref 5–15)
BUN: 20 mg/dL (ref 6–20)
CO2: 27 mmol/L (ref 22–32)
Calcium: 8.9 mg/dL (ref 8.9–10.3)
Chloride: 103 mmol/L (ref 98–111)
Creatinine, Ser: 1.02 mg/dL — ABNORMAL HIGH (ref 0.44–1.00)
GFR, Estimated: >60 mL/min (ref >60)
Glucose, Bld: 173 mg/dL — ABNORMAL HIGH (ref 70–99)
Potassium: 4.4 mmol/L (ref 3.5–5.1)
Sodium: 136 mmol/L (ref 135–145)

## 2021-01-04 LAB — GLUCOSE, CAPILLARY: Glucose-Capillary: 127 mg/dL — ABNORMAL HIGH (ref 70–99)

## 2021-01-04 LAB — SARS CORONAVIRUS 2 (TAT 6-24 HRS): SARS Coronavirus 2: NEGATIVE

## 2021-01-04 NOTE — Progress Notes (Signed)
PCP - [facility] [place], [place] Cardiologist - patient denies  PPM/ICD - patient denies Device Orders -  Rep Notified -   Chest x-ray - n/a EKG - 01/04/2021 Stress Test - patient denies ECHO - patient denies Cardiac Cath - patient denies  Sleep Study - patient denies CPAP -   Fasting Blood Sugar - patient does not check CBG Checks Blood Sugar _____ times a day  Blood Thinner Instructions: Aspirin Instructions: patient was not given any instructions, last dose 01/04/2021  ERAS Protcol - Clears until 04:30 PRE-SURGERY Ensure or G2- G2 given and instructed to complete by 0430  COVID TEST- performed at PAT appointment 01/04/21   Anesthesia review: n/a  Patient denies shortness of breath, fever, cough and chest pain at PAT appointment   All instructions explained to the patient, with a verbal understanding of the material. Patient agrees to go over the instructions while at home for a better understanding. Patient also instructed to self quarantine after being tested for COVID-19. The opportunity to ask questions was provided.

## 2021-01-05 ENCOUNTER — Ambulatory Visit (HOSPITAL_COMMUNITY): Payer: BC Managed Care – PPO

## 2021-01-05 ENCOUNTER — Ambulatory Visit (HOSPITAL_COMMUNITY): Payer: BC Managed Care – PPO | Admitting: Anesthesiology

## 2021-01-05 ENCOUNTER — Observation Stay (HOSPITAL_COMMUNITY)
Admission: RE | Admit: 2021-01-05 | Discharge: 2021-01-11 | Disposition: A | Discharge status: Home / Self Care | Payer: BC Managed Care – PPO | Attending: Orthopedic Surgery | Admitting: Orthopedic Surgery

## 2021-01-05 ENCOUNTER — Other Ambulatory Visit: Payer: Self-pay

## 2021-01-05 ENCOUNTER — Encounter (HOSPITAL_COMMUNITY): Payer: Self-pay | Admitting: Orthopedic Surgery

## 2021-01-05 ENCOUNTER — Encounter (HOSPITAL_COMMUNITY)
Admission: RE | Disposition: A | Discharge status: Home / Self Care | Payer: Self-pay | Source: Home / Self Care | Attending: Orthopedic Surgery

## 2021-01-05 DIAGNOSIS — F17210 Nicotine dependence, cigarettes, uncomplicated: Secondary | ICD-10-CM | POA: Insufficient documentation

## 2021-01-05 DIAGNOSIS — Z794 Long term (current) use of insulin: Secondary | ICD-10-CM | POA: Diagnosis not present

## 2021-01-05 DIAGNOSIS — Z79899 Other long term (current) drug therapy: Secondary | ICD-10-CM | POA: Insufficient documentation

## 2021-01-05 DIAGNOSIS — X58XXXA Exposure to other specified factors, initial encounter: Secondary | ICD-10-CM | POA: Diagnosis not present

## 2021-01-05 DIAGNOSIS — I10 Essential (primary) hypertension: Secondary | ICD-10-CM | POA: Diagnosis present

## 2021-01-05 DIAGNOSIS — S9304XA Dislocation of right ankle joint, initial encounter: Secondary | ICD-10-CM | POA: Insufficient documentation

## 2021-01-05 DIAGNOSIS — E119 Type 2 diabetes mellitus without complications: Secondary | ICD-10-CM

## 2021-01-05 DIAGNOSIS — Z981 Arthrodesis status: Secondary | ICD-10-CM | POA: Diagnosis not present

## 2021-01-05 DIAGNOSIS — Z20822 Contact with and (suspected) exposure to covid-19: Secondary | ICD-10-CM | POA: Diagnosis not present

## 2021-01-05 DIAGNOSIS — Z7982 Long term (current) use of aspirin: Secondary | ICD-10-CM | POA: Diagnosis not present

## 2021-01-05 DIAGNOSIS — Z419 Encounter for procedure for purposes other than remedying health state, unspecified: Secondary | ICD-10-CM

## 2021-01-05 DIAGNOSIS — F418 Other specified anxiety disorders: Secondary | ICD-10-CM | POA: Diagnosis present

## 2021-01-05 DIAGNOSIS — M19171 Post-traumatic osteoarthritis, right ankle and foot: Secondary | ICD-10-CM | POA: Insufficient documentation

## 2021-01-05 DIAGNOSIS — J189 Pneumonia, unspecified organism: Secondary | ICD-10-CM

## 2021-01-05 DIAGNOSIS — T8484XA Pain due to internal orthopedic prosthetic devices, implants and grafts, initial encounter: Secondary | ICD-10-CM | POA: Diagnosis not present

## 2021-01-05 DIAGNOSIS — Z6841 Body Mass Index (BMI) 40.0 and over, adult: Secondary | ICD-10-CM

## 2021-01-05 DIAGNOSIS — S8262XA Displaced fracture of lateral malleolus of left fibula, initial encounter for closed fracture: Principal | ICD-10-CM | POA: Insufficient documentation

## 2021-01-05 DIAGNOSIS — Y722 Prosthetic and other implants, materials and accessory otorhinolaryngological devices associated with adverse incidents: Secondary | ICD-10-CM | POA: Insufficient documentation

## 2021-01-05 HISTORY — DX: Body mass index (BMI) 50.0-59.9, adult: E66.01

## 2021-01-05 HISTORY — DX: Other specified anxiety disorders: F41.8

## 2021-01-05 HISTORY — PX: HARDWARE REMOVAL: SHX979

## 2021-01-05 LAB — GLUCOSE, CAPILLARY
Glucose-Capillary: 93 mg/dL (ref 70–99)
Glucose-Capillary: 116 mg/dL — ABNORMAL HIGH (ref 70–99)
Glucose-Capillary: 177 mg/dL — ABNORMAL HIGH (ref 70–99)
Glucose-Capillary: 395 mg/dL — ABNORMAL HIGH (ref 70–99)
Glucose-Capillary: 342 mg/dL — ABNORMAL HIGH (ref 70–99)
Glucose-Capillary: 318 mg/dL — ABNORMAL HIGH (ref 70–99)

## 2021-01-05 SURGERY — REMOVAL, HARDWARE
Anesthesia: Regional | Site: Leg Lower | Laterality: Right

## 2021-01-05 MED ORDER — OXYCODONE HCL 5 MG PO TABS: 5.0000 mg | ORAL_TABLET | Freq: Once | ORAL | Status: DC | PRN

## 2021-01-05 MED ORDER — ONDANSETRON HCL 4 MG/2ML IJ SOLN: 4.0000 mg | Freq: Once | INTRAMUSCULAR | Status: DC | PRN

## 2021-01-05 MED ORDER — OXYCODONE HCL 5 MG/5ML PO SOLN: 5.0000 mg | Freq: Once | ORAL | Status: DC | PRN

## 2021-01-05 MED ORDER — SODIUM CHLORIDE 0.9 % IV SOLN: INTRAVENOUS | Status: DC

## 2021-01-05 MED ORDER — CEFAZOLIN IN SODIUM CHLORIDE 3-0.9 GM/100ML-% IV SOLN
3.0000 g | INTRAVENOUS | Status: AC
  Administered 2021-01-05: 08:00:00 3 g via INTRAVENOUS
  Filled 2021-01-05: qty 100

## 2021-01-05 MED ORDER — METHOCARBAMOL 500 MG PO TABS
500.0000 mg | ORAL_TABLET | Freq: Four times a day (QID) | ORAL | Status: DC | PRN
  Administered 2021-01-06 – 2021-01-11 (×8): 500 mg via ORAL
  Filled 2021-01-05 (×9): qty 1

## 2021-01-05 MED ORDER — BUPIVACAINE HCL 0.5 % IJ SOLN
INTRAMUSCULAR | Status: DC | PRN
  Administered 2021-01-05: 40 mL

## 2021-01-05 MED ORDER — LOSARTAN POTASSIUM 25 MG PO TABS
25.0000 mg | ORAL_TABLET | Freq: Every day | ORAL | Status: DC
  Administered 2021-01-06 – 2021-01-11 (×6): 25 mg via ORAL
  Filled 2021-01-05 (×6): qty 1

## 2021-01-05 MED ORDER — ONDANSETRON HCL 4 MG/2ML IJ SOLN: 4.0000 mg | Freq: Four times a day (QID) | INTRAMUSCULAR | Status: DC | PRN

## 2021-01-05 MED ORDER — FENTANYL CITRATE (PF) 250 MCG/5ML IJ SOLN
INTRAMUSCULAR | Status: DC | PRN
  Administered 2021-01-05 (×3): 50 ug via INTRAVENOUS

## 2021-01-05 MED ORDER — PAROXETINE HCL 20 MG PO TABS
20.0000 mg | ORAL_TABLET | Freq: Every day | ORAL | Status: DC
  Administered 2021-01-06 – 2021-01-11 (×6): 20 mg via ORAL
  Filled 2021-01-05 (×6): qty 1

## 2021-01-05 MED ORDER — SENNA 8.6 MG PO TABS
1.0000 | ORAL_TABLET | Freq: Two times a day (BID) | ORAL | Status: DC
  Administered 2021-01-06 – 2021-01-11 (×10): 8.6 mg via ORAL
  Filled 2021-01-05 (×11): qty 1

## 2021-01-05 MED ORDER — ACETAMINOPHEN 500 MG PO TABS: 1000.0000 mg | ORAL_TABLET | Freq: Once | ORAL | Status: DC

## 2021-01-05 MED ORDER — PHENYLEPHRINE 40 MCG/ML (10ML) SYRINGE FOR IV PUSH (FOR BLOOD PRESSURE SUPPORT)
PREFILLED_SYRINGE | INTRAVENOUS | Status: DC | PRN
  Administered 2021-01-05: 80 ug via INTRAVENOUS
  Administered 2021-01-05: 120 ug via INTRAVENOUS

## 2021-01-05 MED ORDER — ACETAMINOPHEN 10 MG/ML IV SOLN
INTRAVENOUS | Status: AC
  Filled 2021-01-05: qty 100

## 2021-01-05 MED ORDER — LACTATED RINGERS IV SOLN: INTRAVENOUS | Status: DC

## 2021-01-05 MED ORDER — PROPOFOL 10 MG/ML IV BOLUS
INTRAVENOUS | Status: DC | PRN
  Administered 2021-01-05: 200 mg via INTRAVENOUS
  Administered 2021-01-05: 100 mg via INTRAVENOUS

## 2021-01-05 MED ORDER — MIDAZOLAM HCL 2 MG/2ML IJ SOLN
INTRAMUSCULAR | Status: AC
  Filled 2021-01-05: qty 2

## 2021-01-05 MED ORDER — ORAL CARE MOUTH RINSE: 15.0000 mL | Freq: Once | OROMUCOSAL | Status: AC

## 2021-01-05 MED ORDER — CARVEDILOL 12.5 MG PO TABS
12.5000 mg | ORAL_TABLET | Freq: Two times a day (BID) | ORAL | Status: DC
  Administered 2021-01-05 – 2021-01-11 (×12): 12.5 mg via ORAL
  Filled 2021-01-05 (×12): qty 1

## 2021-01-05 MED ORDER — 0.9 % SODIUM CHLORIDE (POUR BTL) OPTIME
TOPICAL | Status: DC | PRN
  Administered 2021-01-05: 08:00:00 1000 mL

## 2021-01-05 MED ORDER — ENOXAPARIN SODIUM 40 MG/0.4ML IJ SOSY: 40.0000 mg | PREFILLED_SYRINGE | INTRAMUSCULAR | Status: DC

## 2021-01-05 MED ORDER — ONDANSETRON HCL 4 MG PO TABS: 4.0000 mg | ORAL_TABLET | Freq: Four times a day (QID) | ORAL | Status: DC | PRN

## 2021-01-05 MED ORDER — DIPHENHYDRAMINE HCL 12.5 MG/5ML PO ELIX
12.5000 mg | ORAL_SOLUTION | ORAL | Status: DC | PRN
  Administered 2021-01-08 – 2021-01-10 (×2): 25 mg via ORAL
  Filled 2021-01-05 (×2): qty 10

## 2021-01-05 MED ORDER — ACETAMINOPHEN 325 MG PO TABS
325.0000 mg | ORAL_TABLET | Freq: Four times a day (QID) | ORAL | Status: DC | PRN
  Administered 2021-01-06 – 2021-01-11 (×2): 650 mg via ORAL
  Filled 2021-01-05 (×2): qty 2

## 2021-01-05 MED ORDER — OXYCODONE HCL 5 MG PO TABS
5.0000 mg | ORAL_TABLET | ORAL | Status: DC | PRN
Start: 2021-01-05 — End: 2021-01-11
  Administered 2021-01-05 – 2021-01-06 (×3): 10 mg via ORAL
  Administered 2021-01-07: 12:00:00 5 mg via ORAL
  Filled 2021-01-05 (×2): qty 2
  Filled 2021-01-05: qty 1
  Filled 2021-01-05 (×3): qty 2

## 2021-01-05 MED ORDER — ONDANSETRON HCL 4 MG/2ML IJ SOLN
INTRAMUSCULAR | Status: DC | PRN
  Administered 2021-01-05: 4 mg via INTRAVENOUS

## 2021-01-05 MED ORDER — LIDOCAINE 2% (20 MG/ML) 5 ML SYRINGE
INTRAMUSCULAR | Status: DC | PRN
  Administered 2021-01-05: 40 mg via INTRAVENOUS

## 2021-01-05 MED ORDER — ACETAMINOPHEN 10 MG/ML IV SOLN
INTRAVENOUS | Status: DC | PRN
  Administered 2021-01-05: 1000 mg via INTRAVENOUS

## 2021-01-05 MED ORDER — HYDROMORPHONE HCL 1 MG/ML IJ SOLN: 0.2500 mg | INTRAMUSCULAR | Status: DC | PRN

## 2021-01-05 MED ORDER — AMISULPRIDE (ANTIEMETIC) 5 MG/2ML IV SOLN: 10.0000 mg | Freq: Once | INTRAVENOUS | Status: DC | PRN

## 2021-01-05 MED ORDER — VANCOMYCIN HCL 500 MG IV SOLR
INTRAVENOUS | Status: AC
  Filled 2021-01-05: qty 10

## 2021-01-05 MED ORDER — DOCUSATE SODIUM 100 MG PO CAPS
100.0000 mg | ORAL_CAPSULE | Freq: Two times a day (BID) | ORAL | Status: DC
  Administered 2021-01-06 – 2021-01-11 (×10): 100 mg via ORAL
  Filled 2021-01-05 (×12): qty 1

## 2021-01-05 MED ORDER — DEXAMETHASONE SODIUM PHOSPHATE 10 MG/ML IJ SOLN
INTRAMUSCULAR | Status: DC | PRN
  Administered 2021-01-05: 8 mg via INTRAVENOUS

## 2021-01-05 MED ORDER — OXYBUTYNIN CHLORIDE 5 MG PO TABS
5.0000 mg | ORAL_TABLET | Freq: Two times a day (BID) | ORAL | Status: DC
  Administered 2021-01-05 – 2021-01-11 (×12): 5 mg via ORAL
  Filled 2021-01-05 (×12): qty 1

## 2021-01-05 MED ORDER — HYDRALAZINE HCL 20 MG/ML IJ SOLN: 10.0000 mg | Freq: Once | INTRAMUSCULAR | Status: DC

## 2021-01-05 MED ORDER — PHENYLEPHRINE HCL-NACL 20-0.9 MG/250ML-% IV SOLN
INTRAVENOUS | Status: DC | PRN
  Administered 2021-01-05: 50 ug/min via INTRAVENOUS

## 2021-01-05 MED ORDER — KETOROLAC TROMETHAMINE 30 MG/ML IJ SOLN
INTRAMUSCULAR | Status: DC | PRN
  Administered 2021-01-05: 30 mg via INTRAVENOUS

## 2021-01-05 MED ORDER — HYDROMORPHONE HCL 1 MG/ML IJ SOLN
0.5000 mg | INTRAMUSCULAR | Status: DC | PRN
  Administered 2021-01-07 – 2021-01-09 (×5): 1 mg via INTRAVENOUS
  Filled 2021-01-05 (×5): qty 1

## 2021-01-05 MED ORDER — FENTANYL CITRATE (PF) 250 MCG/5ML IJ SOLN
INTRAMUSCULAR | Status: AC
  Filled 2021-01-05: qty 5

## 2021-01-05 MED ORDER — MIDAZOLAM HCL 2 MG/2ML IJ SOLN
INTRAMUSCULAR | Status: DC | PRN
Start: 2021-01-05 — End: 2021-01-05
  Administered 2021-01-05: 1 mg via INTRAVENOUS

## 2021-01-05 MED ORDER — KETOROLAC TROMETHAMINE 30 MG/ML IJ SOLN
INTRAMUSCULAR | Status: AC
  Filled 2021-01-05: qty 1

## 2021-01-05 MED ORDER — OXYCODONE HCL 5 MG PO TABS
10.0000 mg | ORAL_TABLET | ORAL | Status: DC | PRN
Start: 2021-01-05 — End: 2021-01-11
  Administered 2021-01-07: 16:00:00 15 mg via ORAL
  Administered 2021-01-07 – 2021-01-08 (×2): 10 mg via ORAL
  Administered 2021-01-08: 10:00:00 15 mg via ORAL
  Administered 2021-01-09: 06:00:00 10 mg via ORAL
  Administered 2021-01-09 – 2021-01-10 (×5): 15 mg via ORAL
  Administered 2021-01-11: 13:00:00 10 mg via ORAL
  Filled 2021-01-05 (×2): qty 3
  Filled 2021-01-05 (×2): qty 2
  Filled 2021-01-05 (×6): qty 3

## 2021-01-05 MED ORDER — VANCOMYCIN HCL 500 MG IV SOLR
INTRAVENOUS | Status: DC | PRN
  Administered 2021-01-05: 500 mg

## 2021-01-05 MED ORDER — METHOCARBAMOL 1000 MG/10ML IJ SOLN
500.0000 mg | Freq: Four times a day (QID) | INTRAVENOUS | Status: DC | PRN
  Filled 2021-01-05: qty 5

## 2021-01-05 MED ORDER — GABAPENTIN 300 MG PO CAPS
900.0000 mg | ORAL_CAPSULE | Freq: Every day | ORAL | Status: DC
  Administered 2021-01-05 – 2021-01-10 (×6): 900 mg via ORAL
  Filled 2021-01-05 (×6): qty 3

## 2021-01-05 MED ORDER — DEXAMETHASONE SODIUM PHOSPHATE 10 MG/ML IJ SOLN
INTRAMUSCULAR | Status: DC | PRN
  Administered 2021-01-05: 07:00:00 10 mg via INTRAVENOUS

## 2021-01-05 MED ORDER — INSULIN ASPART 100 UNIT/ML IJ SOLN
0.0000 [IU] | Freq: Three times a day (TID) | INTRAMUSCULAR | Status: DC
  Administered 2021-01-05: 17:00:00 15 [IU] via SUBCUTANEOUS
  Administered 2021-01-06: 08:00:00 20 [IU] via SUBCUTANEOUS

## 2021-01-05 MED ORDER — INSULIN GLARGINE-YFGN 100 UNIT/ML ~~LOC~~ SOLN
44.0000 [IU] | Freq: Every day | SUBCUTANEOUS | Status: DC
  Administered 2021-01-05 – 2021-01-09 (×5): 44 [IU] via SUBCUTANEOUS
  Filled 2021-01-05 (×5): qty 0.44

## 2021-01-05 MED ORDER — CHLORHEXIDINE GLUCONATE 0.12 % MT SOLN
15.0000 mL | Freq: Once | OROMUCOSAL | Status: AC
  Administered 2021-01-05: 06:00:00 15 mL via OROMUCOSAL
  Filled 2021-01-05: qty 15

## 2021-01-05 MED ORDER — INSULIN ASPART 100 UNIT/ML IJ SOLN
0.0000 [IU] | Freq: Every day | INTRAMUSCULAR | Status: DC
Start: 2021-01-05 — End: 2021-01-06
  Administered 2021-01-05: 22:00:00 3 [IU] via SUBCUTANEOUS

## 2021-01-05 MED ORDER — HYDROXYZINE HCL 50 MG PO TABS
50.0000 mg | ORAL_TABLET | Freq: Every day | ORAL | Status: DC
  Administered 2021-01-05 – 2021-01-10 (×6): 50 mg via ORAL
  Filled 2021-01-05 (×9): qty 1

## 2021-01-05 SURGICAL SUPPLY — 76 items
BAG COUNTER SPONGE SURGICOUNT (BAG) ×2 IMPLANT
BAG SURGICOUNT SPONGE COUNTING (BAG) ×1
BANDAGE ESMARK 6X9 LF (GAUZE/BANDAGES/DRESSINGS) ×1 IMPLANT
BIT DRILL CALIBRATED 4.3X320MM (BIT) IMPLANT
BIT DRILL CANN 7X200 (BIT) ×2 IMPLANT
BLADE AVERAGE 25MMX9MM (BLADE) ×1
BLADE AVERAGE 25X9 (BLADE) ×1 IMPLANT
BLADE SURG 10 STRL SS (BLADE) ×3 IMPLANT
BLADE SURG 15 STRL LF DISP TIS (BLADE) IMPLANT
BLADE SURG 15 STRL SS (BLADE) ×6
BNDG COHESIVE 4X5 TAN STRL (GAUZE/BANDAGES/DRESSINGS) ×3 IMPLANT
BNDG COHESIVE 6X5 TAN STRL LF (GAUZE/BANDAGES/DRESSINGS) ×3 IMPLANT
BNDG ELASTIC 4X5.8 VLCR STR LF (GAUZE/BANDAGES/DRESSINGS) ×2 IMPLANT
BNDG ELASTIC 6X5.8 VLCR STR LF (GAUZE/BANDAGES/DRESSINGS) ×2 IMPLANT
BNDG ESMARK 6X9 LF (GAUZE/BANDAGES/DRESSINGS) ×3
BNDG GAUZE ELAST 4 BULKY (GAUZE/BANDAGES/DRESSINGS) ×4 IMPLANT
CANISTER SUCT 3000ML PPV (MISCELLANEOUS) ×3 IMPLANT
CHLORAPREP W/TINT 26 (MISCELLANEOUS) ×3 IMPLANT
COVER SURGICAL LIGHT HANDLE (MISCELLANEOUS) ×3 IMPLANT
CUFF TOURN SGL QUICK 34 (TOURNIQUET CUFF) ×2
CUFF TOURN SGL QUICK 42 (TOURNIQUET CUFF) IMPLANT
CUFF TRNQT CYL 34X4.125X (TOURNIQUET CUFF) ×1 IMPLANT
DRAPE C-ARM 42X72 X-RAY (DRAPES) ×3 IMPLANT
DRAPE INCISE IOBAN 66X45 STRL (DRAPES) ×2 IMPLANT
DRAPE U-SHAPE 47X51 STRL (DRAPES) ×3 IMPLANT
DRILL CALIBRATED 4.3X320MM (BIT) ×3
DRSG ADAPTIC 3X8 NADH LF (GAUZE/BANDAGES/DRESSINGS) IMPLANT
DRSG MEPITEL 4X7.2 (GAUZE/BANDAGES/DRESSINGS) ×2 IMPLANT
DRSG PAD ABDOMINAL 8X10 ST (GAUZE/BANDAGES/DRESSINGS) IMPLANT
ELECT REM PT RETURN 9FT ADLT (ELECTROSURGICAL) ×3
ELECTRODE REM PT RTRN 9FT ADLT (ELECTROSURGICAL) ×1 IMPLANT
GAUZE SPONGE 4X4 12PLY STRL (GAUZE/BANDAGES/DRESSINGS) ×4 IMPLANT
GLOVE SRG 8 PF TXTR STRL LF DI (GLOVE) ×2 IMPLANT
GLOVE SURG ENC MOIS LTX SZ8 (GLOVE) ×3 IMPLANT
GLOVE SURG LTX SZ8 (GLOVE) ×3 IMPLANT
GLOVE SURG UNDER POLY LF SZ8 (GLOVE) ×4
GOWN STRL REUS W/ TWL LRG LVL3 (GOWN DISPOSABLE) ×1 IMPLANT
GOWN STRL REUS W/ TWL XL LVL3 (GOWN DISPOSABLE) ×2 IMPLANT
GOWN STRL REUS W/TWL LRG LVL3 (GOWN DISPOSABLE) ×2
GOWN STRL REUS W/TWL XL LVL3 (GOWN DISPOSABLE) ×4
GUIDEPIN VERSANAIL DSP 3.2X444 (ORTHOPEDIC DISPOSABLE SUPPLIES) ×2 IMPLANT
GUIDEWIRE 2.6X80 BEAD TIP (WIRE) IMPLANT
GUIDWIRE 2.6X80 BEAD TIP (WIRE) ×3
KIT BASIN OR (CUSTOM PROCEDURE TRAY) ×3 IMPLANT
KIT TURNOVER KIT B (KITS) ×3 IMPLANT
NAIL ANKLE LOCK ANTE 10X180 (Nail) ×2 IMPLANT
NEEDLE 22X1 1/2 (OR ONLY) (NEEDLE) IMPLANT
NS IRRIG 1000ML POUR BTL (IV SOLUTION) ×3 IMPLANT
PACK ORTHO EXTREMITY (CUSTOM PROCEDURE TRAY) ×3 IMPLANT
PAD ARMBOARD 7.5X6 YLW CONV (MISCELLANEOUS) ×6 IMPLANT
PAD CAST 4YDX4 CTTN HI CHSV (CAST SUPPLIES) ×1 IMPLANT
PADDING CAST COTTON 4X4 STRL (CAST SUPPLIES) ×2
SCREW CORT TI DBL LEAD 5X22 (Screw) ×4 IMPLANT
SCREW CORT TI DBL LEAD 5X32 (Screw) ×2 IMPLANT
SCREW CORT TI DBL LEAD 5X65 (Screw) ×2 IMPLANT
SOAP 2 % CHG 4 OZ (WOUND CARE) ×3 IMPLANT
SPLINT PLASTER CAST XFAST 5X30 (CAST SUPPLIES) IMPLANT
SPLINT PLASTER XFAST SET 5X30 (CAST SUPPLIES) ×4
SPONGE T-LAP 4X18 ~~LOC~~+RFID (SPONGE) ×3 IMPLANT
STAPLER VISISTAT 35W (STAPLE) ×2 IMPLANT
SUCTION FRAZIER HANDLE 10FR (MISCELLANEOUS) ×2
SUCTION TUBE FRAZIER 10FR DISP (MISCELLANEOUS) ×1 IMPLANT
SUT PROLENE 3 0 PS 2 (SUTURE) ×3 IMPLANT
SUT VIC AB 0 CT1 27 (SUTURE) ×2
SUT VIC AB 0 CT1 27XBRD ANBCTR (SUTURE) IMPLANT
SUT VIC AB 2-0 CT1 27 (SUTURE) ×4
SUT VIC AB 2-0 CT1 TAPERPNT 27 (SUTURE) ×2 IMPLANT
SUT VIC AB 3-0 PS2 18 (SUTURE) ×2
SUT VIC AB 3-0 PS2 18XBRD (SUTURE) ×1 IMPLANT
SYR CONTROL 10ML LL (SYRINGE) IMPLANT
TIP THREADED COCR 32.X460 (TIP) ×2 IMPLANT
TOWEL GREEN STERILE (TOWEL DISPOSABLE) ×3 IMPLANT
TOWEL GREEN STERILE FF (TOWEL DISPOSABLE) ×3 IMPLANT
TUBE CONNECTING 12'X1/4 (SUCTIONS) ×1
TUBE CONNECTING 12X1/4 (SUCTIONS) ×2 IMPLANT
WATER STERILE IRR 1000ML POUR (IV SOLUTION) ×3 IMPLANT

## 2021-01-05 NOTE — Transfer of Care (Signed)
Immediate Anesthesia Transfer of Care Note  Patient: Cindy Ayers  Procedure(s) Performed: Removal deep implants right lateral malleolus; tibio-talar calcaneal nailing from a lateral approach (Right: Leg Lower)  Patient Location: PACU  Anesthesia Type:GA combined with regional for post-op pain  Level of Consciousness: awake and alert   Airway & Oxygen Therapy: Patient Spontanous Breathing  Post-op Assessment: Report given to RN and Post -op Vital signs reviewed and stable  Post vital signs: Reviewed and stable  Last Vitals:  Vitals Value Taken Time  BP 166/106 01/05/21 1030  Temp    Pulse 82 01/05/21 1031  Resp 17 01/05/21 1031  SpO2 98 % 01/05/21 1031  Vitals shown include unvalidated device data.  Last Pain:  Vitals:   01/05/21 0602  TempSrc:   PainSc: 7          Complications: No notable events documented.

## 2021-01-05 NOTE — Assessment & Plan Note (Signed)
-  Continue Coreg, Cozaar

## 2021-01-05 NOTE — Anesthesia Procedure Notes (Signed)
Anesthesia Regional Block: Adductor canal block   Pre-Anesthetic Checklist: , timeout performed,  Correct Patient, Correct Site, Correct Laterality,  Correct Procedure, Correct Position, site marked,  Risks and benefits discussed,  Surgical consent,  Pre-op evaluation,  At surgeon's request and post-op pain management  Laterality: Right  Prep: Maximum Sterile Barrier Precautions used, chloraprep       Needles:  Injection technique: Single-shot  Needle Type: Echogenic Stimulator Needle     Needle Length: 9cm  Needle Gauge: 22     Additional Needles:   Procedures:,,,, ultrasound used (permanent image in chart),,    Narrative:  Start time: 01/05/2021 7:15 AM End time: 01/05/2021 7:20 AM Injection made incrementally with aspirations every 5 mL.  Performed by: Personally  Anesthesiologist: Lannie Fields, DO  Additional Notes: Monitors applied. No increased pain on injection. No increased resistance to injection. Injection made in 5cc increments. Good needle visualization. Patient tolerated procedure well.

## 2021-01-05 NOTE — Assessment & Plan Note (Signed)
-  Management per orthopedics -Appears likely to need SNF rehab; will place [facility] consult

## 2021-01-05 NOTE — Assessment & Plan Note (Signed)
-  Body mass index is 50.4 kg/m..  -Weight loss should be encouraged -Outpatient PCP/bariatric medicine/bariatric surgery f/u encouraged

## 2021-01-05 NOTE — Op Note (Signed)
01/05/2021  10:28 AM  PATIENT:  Cindy Ayers  60 y.o. female  PRE-OPERATIVE DIAGNOSIS: 1.  Right fibula nonunion status post ORIF 2.  Painful hardware at the right fibula 3.  Posttraumatic arthritis of the right ankle 4.  Right ankle dislocation  POST-OPERATIVE DIAGNOSIS: Same  Procedure(s): 1.  Removal of deep implants from the right fibula 2.  Open treatment of right ankle dislocation 3.  Right ankle arthrodesis 4.  Autograft bone from the distal fibula to the ankle 5.  Right subtalar arthrodesis  SURGEON:  Toni Arthurs, MD  ASSISTANT: Alfredo Martinez, PA-C  ANESTHESIA:   General, regional  EBL:  minimal   TOURNIQUET:   Total Tourniquet Time Documented: Thigh (Right) - 120 minutes Total: Thigh (Right) - 120 minutes  COMPLICATIONS:  None apparent  DISPOSITION:  Extubated, awake and stable to recovery.  INDICATION FOR PROCEDURE: The patient is a 60 year old female with a past medical history significant for morbid obesity and diabetes.  She injured her right ankle many months ago and underwent open treatment of her fibula fracture with internal fixation.  She walked on the injured extremity despite nonweightbearing instructions throughout the postoperative period.  Her fibula developed a nonunion with hardware failure and lateral dislocation of the talus relative to the distal tibia.  She developed posttraumatic arthritis as well.  She has failed nonoperative treatment to date and presents today for open reduction of the ankle joint and fibular hardware removal with arthrodesis of the ankle and subtalar joints.  The risks and benefits of the alternative treatment options have been discussed in detail.  The patient wishes to proceed with surgery and specifically understands risks of bleeding, infection, nerve damage, blood clots, need for additional surgery, amputation and death.   PROCEDURE IN DETAIL: After preoperative consent was obtained and the correct operative site was  identified, the patient was brought the operating room and placed upon the operating table.  General anesthesia was induced.  Preoperative antibiotics were administered.  A surgical timeout was taken.  The right lower extremity was prepped and draped in standard sterile fashion with a tourniquet around the calf.  The extremity was elevated and the tourniquet was inflated to 200 mmHg.  The patient's previous lateral incision was opened again sharply and extended at the distal end anteriorly.  Dissection was carried down through the subcutaneous tissues to the lateral aspect of the plate.  Scar and adherent fibrous tissue was cleaned from the plate with a rondure and elevator.  The 6 screws in the plate and the 2 fragments of plate were removed without difficulty.  The lag screw was identified posteriorly.  It was removed as well.  The fibular fracture site was identified.  It was mobilized with an osteotome.  The distal fibular fragment was dissected free from the surrounding soft tissues in subperiosteal fashion.  It was passed off the field where it was morselized in the bone mill for use as bone graft.  The ankle joint was noted to be dislocated.  It was opened and cleaned of all scar tissue.  The joint was reduced.  It was opened with a lamina spreader.  The remaining articular cartilage and subchondral bone from both sides of the joint were removed with a rondure and curettes.  An accessory incision was made at the anteromedial joint line.  Dissection was carried down to the joint.  The medial gutter was cleaned of all scar tissue blocking reduction.  The subtalar joint was then opened and cleaned of  all articular cartilage.  Both joints were then irrigated copiously.  A small drill bit was used to perforate both sides of both joints leaving resultant bone graft in place.  Autograft bone was packed into the ankle joint as well.  The ankle and subtalar joints were reduced.  An incision was made at the  plantar aspect of the foot just anterior to the heel pad.  Dissection was carried down through the subcutaneous tissues to the plantar aspect of the calcaneus.  A guidepin was then inserted under fluoroscopic guidance in the AP and lateral planes.  It was advanced across the subtalar and ankle joints and into the tibial shaft.  Appropriate reduction was confirmed with radiographs.  The guidepin was then overdrilled and exchanged for a ball-tipped guidewire.  The guidewire was reamed to a diameter of 11-1/2 mm.  A 10 mm x 180 mm [employer] [place] nail was inserted over the guidewire and seated appropriately at the plantar calcaneus.  The targeting jig was used to insert 2 proximal screws in percutaneous fashion.  The targeting jig was then used to insert a screw in the talus from lateral to medial.  This was used to compress the tibiotalar joint appropriately.  The subtalar joint was compressed.  The targeting jig was then used to insert a screw from posterior to anterior in the calcaneus.  The screw was locked to the nail.  Final AP and lateral radiographs of the ankle, tibia and foot were obtained as well as a Harris heel view showing appropriate position of all hardware and appropriate position and length of the screws.  The wounds were irrigated copiously.  More bone graft was packed into the ankle joint.  Vancomycin powder was sprinkled in the wounds.  Subcutaneous tissues were approximated with Vicryl.  Skin incisions were closed with staples.  Sterile dressings were applied followed by a well-padded short leg splint.  The tourniquet was released after application of the dressings.  The patient was awakened from anesthesia and transported to the recovery room in stable condition.   FOLLOW UP PLAN: Nonweightbearing on the right lower extremity.  Admission to the hospital for physical therapy and Occupational Therapy and consideration for inpatient rehab.  Hospitalist consultation for management of her  medical comorbidities.  Lovenox for DVT prophylaxis.    Alfredo Martinez PA-C was present and scrubbed for the duration of the operative case. His assistance was essential in positioning the patient, prepping and draping, gaining and maintaining exposure, performing the operation, closing and dressing the wounds and applying the splint.

## 2021-01-05 NOTE — OR Nursing (Signed)
Explanted 7 screws.

## 2021-01-05 NOTE — Assessment & Plan Note (Signed)
-  Continue Paxil, hydroxyzine

## 2021-01-05 NOTE — Anesthesia Procedure Notes (Signed)
Anesthesia Regional Block: Popliteal block   Pre-Anesthetic Checklist: , timeout performed,  Correct Patient, Correct Site, Correct Laterality,  Correct Procedure, Correct Position, site marked,  Risks and benefits discussed,  Surgical consent,  Pre-op evaluation,  At surgeon's request and post-op pain management  Laterality: Right  Prep: Maximum Sterile Barrier Precautions used, chloraprep       Needles:  Injection technique: Single-shot  Needle Type: Echogenic Stimulator Needle     Needle Length: 9cm  Needle Gauge: 22     Additional Needles:   Procedures:,,,, ultrasound used (permanent image in chart),,    Narrative:  Start time: 01/05/2021 7:10 AM End time: 01/05/2021 7:15 AM Injection made incrementally with aspirations every 5 mL.  Performed by: Personally  Anesthesiologist: Lannie Fields, DO  Additional Notes: Monitors applied. No increased pain on injection. No increased resistance to injection. Injection made in 5cc increments. Good needle visualization. Patient tolerated procedure well.

## 2021-01-05 NOTE — Anesthesia Procedure Notes (Signed)
Procedure Name: Intubation Date/Time: 01/05/2021 7:35 AM Performed by: Darryl Nestle, CRNA Pre-anesthesia Checklist: Patient identified, Emergency Drugs available, Suction available and Patient being monitored Patient Re-evaluated:Patient Re-evaluated prior to induction Oxygen Delivery Method: Circle system utilized Preoxygenation: Pre-oxygenation with 100% oxygen Induction Type: IV induction Ventilation: Mask ventilation without difficulty LMA: LMA inserted LMA Size: 3.0 Tube type: Oral Number of attempts: 1 Placement Confirmation: positive ETCO2 and breath sounds checked- equal and bilateral Tube secured with: Tape Dental Injury: Teeth and Oropharynx as per pre-operative assessment

## 2021-01-05 NOTE — Discharge Instructions (Signed)
Cindy Hewitt, MD [facility]  Please read the following information regarding your care after surgery.  Medications  You only need a prescription for the narcotic pain medicine (ex. oxycodone, Percocet, Norco).  All of the other medicines listed below are available over the counter. ? Aleve 2 pills twice a day for the first 3 days after surgery. ? acetominophen (Tylenol) 650 mg every 4-6 hours as you need for minor to moderate pain ? oxycodone as prescribed for severe pain  Narcotic pain medicine (ex. oxycodone, Percocet, Vicodin) will cause constipation.  To prevent this problem, take the following medicines while you are taking any pain medicine. ? docusate sodium (Colace) 100 mg twice a day ? senna (Senokot) 2 tablets twice a day  ? To help prevent blood clots, take Xarelto as prescribed for two weeks after surgery.  You should also get up every hour while you are awake to move around.    Weight Bearing ? Do not bear any weight on the operated leg or foot.  Cast / Splint / Dressing ? Keep your splint, cast or dressing clean and dry.  Don't put anything (coat hanger, pencil, etc) down inside of it.  If it gets damp, use a hair dryer on the cool setting to dry it.  If it gets soaked, call the office to schedule an appointment for a cast change.    After your dressing, cast or splint is removed; you may shower, but do not soak or scrub the wound.  Allow the water to run over it, and then gently pat it dry.  Swelling It is normal for you to have swelling where you had surgery.  To reduce swelling and pain, keep your toes above your nose for at least 3 days after surgery.  It may be necessary to keep your foot or leg elevated for several weeks.  If it hurts, it should be elevated.  Follow Up Call my office at [phone] when you are discharged from the hospital or surgery center to schedule an appointment to be seen two weeks after surgery.  Call my office at [phone] if you develop  a fever >101.5 F, nausea, vomiting, bleeding from the surgical site or severe pain.     

## 2021-01-05 NOTE — Consult Note (Signed)
Initial Consultation Note   Patient: Cindy Ayers XMI:[id number] DOB: [birth year] PCP: [facility] DOA: 01/05/2021 DOS: the patient was seen and examined on 01/05/2021 Primary service: Toni Arthurs, MD  Referring physician: Victorino Dike - orthopedics Reason for consult: Ankle fusion today.  Likely to need SNF.  Needs medical management.   Assessment/Plan * Status post ankle arthrodesis -Management per orthopedics -Appears likely to need SNF rehab; will place [facility] consult  Depression with anxiety- (present on admission) -Continue Paxil, hydroxyzine  Morbid obesity with BMI of 50.0-59.9, adult (HCC) -Body mass index is 50.4 kg/m..  -Weight loss should be encouraged -Outpatient PCP/bariatric medicine/bariatric surgery f/u encouraged  Hypertension- (present on admission) -Continue Coreg, Cozaar  Diabetes mellitus without complication (HCC) -Recent A1c was 7.3, indicating suboptimal control -Continue [place] -Cover with moderate-scale SSI     [facility] will continue to follow the patient.    HPI: Cindy Ayers is a 60 y.o. female with past medical history of DM; HTN; morbid obesity; and depression/fibromyalgia who presented today for R fibula nonunion s/p ORIF.  She had operative removal of the deep implants from the R fibula and ankle arthrodesis.  She denied complaints while in the PACU.  She reports poor glycemic control as an outpatient.   Review of Systems: As mentioned in the history of present illness. All other systems reviewed and are negative. Past Medical History:  Diagnosis Date   Anemia    Arthritis    Depression with anxiety    Diabetes mellitus without complication (HCC)    Fibromyalgia    Hypertension    Morbid obesity with BMI of 50.0-59.9, adult (HCC)    Necrotizing fasciitis (HCC)    Past Surgical History:  Procedure Laterality Date   incision and drainage left thigh Left    Social History:  reports that she has been smoking  cigarettes. She has been smoking an average of .5 packs per day. She has never used smokeless tobacco. She reports that she does not currently use alcohol. She reports that she does not use drugs.  Allergies  Allergen Reactions   Ethylenediamine     Pt unaware of allergy    Antihistamines, Chlorpheniramine-Type Palpitations    Family History  Problem Relation Age of Onset   Diabetes Sister     Prior to Admission medications   Medication Sig Start Date End Date Taking? Authorizing Provider  aspirin EC 81 MG tablet Take 81 mg by mouth daily.   Yes [provider]  carvedilol (COREG) 12.5 MG tablet Take 12.5 mg by mouth 2 (two) times daily with a meal.   Yes [provider]  Cholecalciferol (VITAMIN D) 50 MCG (2000 UT) tablet Take 2,000 Units by mouth daily.   Yes [provider]  gabapentin (NEURONTIN) 300 MG capsule Take 900 mg by mouth at bedtime.   Yes [provider]  hydrOXYzine (VISTARIL) 50 MG capsule Take 50 mg by mouth at bedtime. 11/04/20  Yes [provider]  insulin degludec (TRESIBA FLEXTOUCH) 100 UNIT/ML FlexTouch Pen Inject 44 Units into the skin daily.   Yes [provider]  losartan (COZAAR) 25 MG tablet Take 25 mg by mouth daily.   Yes [provider]  meloxicam (MOBIC) 15 MG tablet Take 15 mg by mouth daily.   Yes [provider]  oxybutynin (DITROPAN) 5 MG tablet Take 5 mg by mouth 2 (two) times daily.   Yes [provider]  PARoxetine (PAXIL) 20 MG tablet Take 20 mg by mouth  daily.   Yes [provider]  traMADol (ULTRAM) 50 MG tablet Take 50 mg by mouth every 4 (four) hours as needed for moderate pain.   Yes [provider]  triamcinolone cream (KENALOG) 0.1 % Apply 1 application topically 2 (two) times daily as needed (eczema). 12/25/20  Yes [provider]    Physical Exam:  General:  Appears calm and comfortable and is in NAD Eyes:  EOMI, normal lids,  iris ENT:  grossly normal hearing, lips & tongue, mmm; poor/absent dentition Neck:  no LAD, masses or thyromegaly Cardiovascular:  RRR, no m/r/g.  Respiratory:   CTA bilaterally with no wheezes/rales/rhonchi.  Normal respiratory effort. Abdomen:  soft, NT, ND Skin:  no rash or induration seen on limited exam Musculoskeletal:  RLE is splinted Psychiatric:  grossly normal mood and affect, speech fluent and appropriate, AOx3 Neurologic:  CN 2-12 grossly intact, moves all extremities in coordinated fashion  Pertinent Labs:   From 12/21: Glucose 173 A1c 7.3 Normal CBC Staph PCR positive but MRSA negative    Family Communication: Primary team to discuss surgery with family Primary team communication: Patient was d/w Dr. Victorino Dike directly Thank you very much for involving [place] in the care of your patient.  Author: Jonah Blue 01/05/2021 1:38 PM  For on call review [email or web address].

## 2021-01-05 NOTE — Anesthesia Preprocedure Evaluation (Addendum)
Anesthesia Evaluation  Patient identified by MRN, date of birth, ID band Patient awake    Reviewed: Allergy & Precautions, NPO status , Patient's Chart, lab work & pertinent test results, reviewed documented beta blocker date and time   Airway Mallampati: III  TM Distance: >3 FB Neck ROM: Full    Dental  (+) Edentulous Upper, Dental Advisory Given, Missing, Poor Dentition,    Pulmonary shortness of breath and with exertion, Current Smoker and Patient abstained from smoking.,  1/2ppd currently for many years- no inhalers   Pulmonary exam normal breath sounds clear to auscultation       Cardiovascular hypertension, Pt. on medications and Pt. on home beta blockers Normal cardiovascular exam Rhythm:Regular Rate:Normal     Neuro/Psych PSYCHIATRIC DISORDERS Anxiety Depression negative neurological ROS     GI/Hepatic negative GI ROS, Neg liver ROS,   Endo/Other  diabetes, Well Controlled, Type 2, Insulin DependentMorbid obesityBMI 50 a1c 7.3 FS 93 in preop   Renal/GU negative Renal ROS  negative genitourinary   Musculoskeletal  (+) Arthritis , Osteoarthritis,  Fibromyalgia -, narcotic dependentRight ankle lateral malleolus fx   Abdominal (+) + obese,   Peds  Hematology negative hematology ROS (+) hct 38.9, plt 240   Anesthesia Other Findings Tramadol 50mg  2x/d  Reproductive/Obstetrics negative OB ROS                           Anesthesia Physical Anesthesia Plan  ASA: 3  Anesthesia Plan: General and Regional   Post-op Pain Management: Tylenol PO (pre-op) and Regional block   Induction: Intravenous  PONV Risk Score and Plan: 2 and Ondansetron, Dexamethasone, Midazolam and Treatment may vary due to age or medical condition  Airway Management Planned: LMA  Additional Equipment: None  Intra-op Plan:   Post-operative Plan: Extubation in OR  Informed Consent: I have reviewed the patients  History and Physical, chart, labs and discussed the procedure including the risks, benefits and alternatives for the proposed anesthesia with the patient or authorized representative who has indicated his/her understanding and acceptance.     Dental advisory given  Plan Discussed with: CRNA  Anesthesia Plan Comments:       Anesthesia Quick Evaluation

## 2021-01-05 NOTE — Assessment & Plan Note (Signed)
-  Recent A1c was 7.3, indicating suboptimal control -Continue [place] -Cover with moderate-scale SSI

## 2021-01-05 NOTE — H&P (Signed)
Cindy Ayers is an 60 y.o. female.   Chief Complaint: right ankle pain HPI: 60 y/o female with PMH of morbid obesity and diabetes c/o pain at the right ankle after ORIF many months ago.  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  She presents now for surgical treatment.  Past Medical History:  Diagnosis Date   Anemia    Anxiety    Arthritis    Depression    Diabetes mellitus without complication (HCC)    Fibromyalgia    Hypertension    Necrotizing fasciitis (HCC)     Past Surgical History:  Procedure Laterality Date   incision and drainage left thigh Left     Family History  Problem Relation Age of Onset   Diabetes Sister    Social History:  reports that she has been smoking cigarettes. She has been smoking an average of .5 packs per day. She has never used smokeless tobacco. She reports that she does not currently use alcohol. She reports that she does not use drugs.  Allergies:  Allergies  Allergen Reactions   Ethylenediamine     Pt unaware of allergy    Antihistamines, Chlorpheniramine-Type Palpitations    Medications Prior to Admission  Medication Sig Dispense Refill   aspirin EC 81 MG tablet Take 81 mg by mouth daily.     carvedilol (COREG) 12.5 MG tablet Take 12.5 mg by mouth 2 (two) times daily with a meal.     Cholecalciferol (VITAMIN D) 50 MCG (2000 UT) tablet Take 2,000 Units by mouth daily.     gabapentin (NEURONTIN) 300 MG capsule Take 900 mg by mouth at bedtime.     hydrOXYzine (VISTARIL) 50 MG capsule Take 50 mg by mouth at bedtime.     insulin degludec (TRESIBA FLEXTOUCH) 100 UNIT/ML FlexTouch Pen Inject 44 Units into the skin daily.     losartan (COZAAR) 25 MG tablet Take 25 mg by mouth daily.     meloxicam (MOBIC) 15 MG tablet Take 15 mg by mouth daily.     oxybutynin (DITROPAN) 5 MG tablet Take 5 mg by mouth 2 (two) times daily.     PARoxetine (PAXIL) 20 MG tablet Take 20 mg by mouth daily.      traMADol (ULTRAM) 50 MG tablet Take 50 mg by mouth every 4 (four) hours as needed for moderate pain.     triamcinolone cream (KENALOG) 0.1 % Apply 1 application topically 2 (two) times daily as needed (eczema).      Results for orders placed or performed during the hospital encounter of 01/05/21 (from the past 48 hour(s))  Glucose, capillary     Status: None   Collection Time: 01/05/21  5:57 AM  Result Value Ref Range   Glucose-Capillary 93 70 - 99 mg/dL    Comment: Glucose reference range applies only to samples taken after fasting for at least 8 hours.   No results found.  Review of Systems  no recent f/c/n/v/wt loss.  Blood pressure 138/67, pulse 61, temperature 98 F (36.7 C), temperature source Oral, resp. rate 18, height 5' 6.5" (1.689 m), weight (!) 143.8 kg, SpO2 98 %. Physical Exam  Morbidly obese female in nad.  A and O.  Normal mood and affect.  EOMI.  Resp unlabored.  R ankle with valgus malalignment.  Skin intact.  Pulses are palpable in the foot.  No lymphadenopathy.  Intact sens to LT dorsally and plantarly at the forefoot.  Active PF  and DF strength at the toes.  Assessment/Plan R ankle lateral malleolus nonunion, hardware failure and post traumatic arthritis.  To the OR today for removal of deep implants and arthrodesis of the ankle and subtalar joints.  The risks and benefits of the alternative treatment options have been discussed in detail.  The patient wishes to proceed with surgery and specifically understands risks of bleeding, infection, nerve damage, blood clots, need for additional surgery, amputation and death.   Toni Arthurs, MD 2021-01-22, 7:21 AM

## 2021-01-05 NOTE — Anesthesia Postprocedure Evaluation (Signed)
Anesthesia Post Note  Patient: Cindy Ayers  Procedure(s) Performed: Removal deep implants right lateral malleolus; tibio-talar calcaneal nailing from a lateral approach (Right: Leg Lower)     Patient location during evaluation: PACU Anesthesia Type: Regional and General Level of consciousness: awake and alert, oriented and patient cooperative Pain management: pain level controlled Vital Signs Assessment: post-procedure vital signs reviewed and stable Respiratory status: spontaneous breathing, nonlabored ventilation and respiratory function stable Cardiovascular status: blood pressure returned to baseline and stable Postop Assessment: no apparent nausea or vomiting Anesthetic complications: no Comments: Hypertensive at baseline   No notable events documented.  Last Vitals:  Vitals:   01/05/21 1100 01/05/21 1115  BP: (!) 175/75 (!) 162/72  Pulse: 72 70  Resp: 10 16  Temp:    SpO2: 98% 99%    Last Pain:  Vitals:   01/05/21 1030  TempSrc:   PainSc: Asleep                 Lannie Fields

## 2021-01-06 DIAGNOSIS — F418 Other specified anxiety disorders: Secondary | ICD-10-CM | POA: Diagnosis not present

## 2021-01-06 DIAGNOSIS — D72825 Bandemia: Secondary | ICD-10-CM

## 2021-01-06 DIAGNOSIS — S8262XA Displaced fracture of lateral malleolus of left fibula, initial encounter for closed fracture: Secondary | ICD-10-CM | POA: Diagnosis not present

## 2021-01-06 DIAGNOSIS — E875 Hyperkalemia: Secondary | ICD-10-CM | POA: Diagnosis not present

## 2021-01-06 DIAGNOSIS — E1165 Type 2 diabetes mellitus with hyperglycemia: Secondary | ICD-10-CM | POA: Diagnosis not present

## 2021-01-06 DIAGNOSIS — Z6841 Body Mass Index (BMI) 40.0 and over, adult: Secondary | ICD-10-CM

## 2021-01-06 DIAGNOSIS — I10 Essential (primary) hypertension: Secondary | ICD-10-CM

## 2021-01-06 DIAGNOSIS — Z981 Arthrodesis status: Secondary | ICD-10-CM | POA: Diagnosis not present

## 2021-01-06 LAB — CBC WITH DIFFERENTIAL/PLATELET
Abs Immature Granulocytes: 0.1 10*3/uL — ABNORMAL HIGH (ref 0.00–0.07)
Basophils Absolute: 0 10*3/uL (ref 0.0–0.1)
Basophils Relative: 0 %
Eosinophils Absolute: 0 10*3/uL (ref 0.0–0.5)
Eosinophils Relative: 0 %
HCT: 34.9 % — ABNORMAL LOW (ref 36.0–46.0)
Hemoglobin: 10.9 g/dL — ABNORMAL LOW (ref 12.0–15.0)
Immature Granulocytes: 1 %
Lymphocytes Relative: 5 %
Lymphs Abs: 0.7 10*3/uL (ref 0.7–4.0)
MCH: 28.6 pg (ref 26.0–34.0)
MCHC: 31.2 g/dL (ref 30.0–36.0)
MCV: 91.6 fL (ref 80.0–100.0)
Monocytes Absolute: 0.5 10*3/uL (ref 0.1–1.0)
Monocytes Relative: 4 %
Neutro Abs: 12.1 10*3/uL — ABNORMAL HIGH (ref 1.7–7.7)
Neutrophils Relative %: 90 %
Platelets: 225 10*3/uL (ref 150–400)
RBC: 3.81 MIL/uL — ABNORMAL LOW (ref 3.87–5.11)
RDW: 14.6 % (ref 11.5–15.5)
WBC: 13.4 10*3/uL — ABNORMAL HIGH (ref 4.0–10.5)
nRBC: 0 % (ref 0.0–0.2)

## 2021-01-06 LAB — GLUCOSE, CAPILLARY
Glucose-Capillary: 365 mg/dL — ABNORMAL HIGH (ref 70–99)
Glucose-Capillary: 239 mg/dL — ABNORMAL HIGH (ref 70–99)
Glucose-Capillary: 112 mg/dL — ABNORMAL HIGH (ref 70–99)
Glucose-Capillary: 205 mg/dL — ABNORMAL HIGH (ref 70–99)

## 2021-01-06 LAB — BASIC METABOLIC PANEL
Anion gap: 6 (ref 5–15)
BUN: 24 mg/dL — ABNORMAL HIGH (ref 6–20)
CO2: 23 mmol/L (ref 22–32)
Calcium: 8.3 mg/dL — ABNORMAL LOW (ref 8.9–10.3)
Chloride: 104 mmol/L (ref 98–111)
Creatinine, Ser: 1.13 mg/dL — ABNORMAL HIGH (ref 0.44–1.00)
GFR, Estimated: 56 mL/min — ABNORMAL LOW (ref >60)
Glucose, Bld: 294 mg/dL — ABNORMAL HIGH (ref 70–99)
Potassium: 5.2 mmol/L — ABNORMAL HIGH (ref 3.5–5.1)
Sodium: 133 mmol/L — ABNORMAL LOW (ref 135–145)

## 2021-01-06 MED ORDER — INSULIN ASPART 100 UNIT/ML IJ SOLN
0.0000 [IU] | Freq: Every day | INTRAMUSCULAR | Status: DC
  Administered 2021-01-06: 22:00:00 2 [IU] via SUBCUTANEOUS

## 2021-01-06 MED ORDER — INSULIN ASPART 100 UNIT/ML IJ SOLN
0.0000 [IU] | Freq: Three times a day (TID) | INTRAMUSCULAR | Status: DC
  Administered 2021-01-06: 13:00:00 7 [IU] via SUBCUTANEOUS
  Administered 2021-01-07 – 2021-01-08 (×3): 3 [IU] via SUBCUTANEOUS
  Administered 2021-01-09: 18:00:00 1 [IU] via SUBCUTANEOUS
  Administered 2021-01-10 (×2): 3 [IU] via SUBCUTANEOUS
  Administered 2021-01-11: 13:00:00 0 [IU] via SUBCUTANEOUS

## 2021-01-06 MED ORDER — INSULIN ASPART 100 UNIT/ML IJ SOLN
6.0000 [IU] | Freq: Three times a day (TID) | INTRAMUSCULAR | Status: DC
  Administered 2021-01-06 – 2021-01-09 (×9): 6 [IU] via SUBCUTANEOUS

## 2021-01-06 MED ORDER — DOCUSATE SODIUM 100 MG PO CAPS: 100.0000 mg | ORAL_CAPSULE | Freq: Two times a day (BID) | ORAL | 0 refills | Status: AC

## 2021-01-06 MED ORDER — SENNA 8.6 MG PO TABS: 2.0000 | ORAL_TABLET | Freq: Two times a day (BID) | ORAL | 0 refills | Status: AC

## 2021-01-06 MED ORDER — ATORVASTATIN CALCIUM 10 MG PO TABS
20.0000 mg | ORAL_TABLET | Freq: Every day | ORAL | Status: DC
  Administered 2021-01-06 – 2021-01-11 (×6): 20 mg via ORAL
  Filled 2021-01-06 (×6): qty 2

## 2021-01-06 MED ORDER — OXYCODONE HCL 5 MG PO TABS: 5.0000 mg | ORAL_TABLET | ORAL | 0 refills | Status: AC | PRN

## 2021-01-06 MED ORDER — RIVAROXABAN 10 MG PO TABS: 10.0000 mg | ORAL_TABLET | Freq: Every day | ORAL | 0 refills | Status: AC

## 2021-01-06 MED ORDER — SODIUM ZIRCONIUM CYCLOSILICATE 10 G PO PACK
10.0000 g | PACK | Freq: Once | ORAL | Status: AC
  Administered 2021-01-06: 08:00:00 10 g via ORAL
  Filled 2021-01-06: qty 1

## 2021-01-06 NOTE — Evaluation (Signed)
Occupational Therapy Evaluation Patient Details Name: Cindy Ayers MRN: [id number] DOB: [birth year] Today's Date: 01/06/2021   History of Present Illness Patient is a 60 y/o female who presents with right lateral malleolus nonunion, hardware failure and post traumatic arthritis now s/p removal of deep implant and tibio-talar calcaneal nailing 01/05/21. PMH includes DM-2, HTN, morbid obesity, anxiety, depression and fibromyalgia   Clinical Impression   Pt presents with decline in function and safety with ADLs and ADL mobility with impaired strength, balance and endurance. Pt reports that she lives at home alone, was Ind with ADLs/selfcare, IADLs, drives, works a a Runner, broadcasting/film/video In [place], [place] and uses a Product manager for mobility. Pt reports that she has a brother that checks on her a few times per week. Pt currently requires max - total A for LB ADLs, min guard A with UB ADLs, total A with toileting and, pt able to perform bed mobility/rolling mod I and assist to scoot along side bed to prepare for transfer to chair. Pt will need to be w/c level at d/c due to NWB status. Pt would benefit from acute OT services to address impairments to maximize level of function and safety     Recommendations for follow up therapy are one component of a multi-disciplinary discharge planning process, led by the attending physician.  Recommendations may be updated based on patient status, additional functional criteria and insurance authorization.   Follow Up Recommendations  Skilled nursing-short term rehab (<3 hours/day)    Assistance Recommended at Discharge Frequent or constant Supervision/Assistance  Functional Status Assessment  Patient has had a recent decline in their functional status and demonstrates the ability to make significant improvements in function in a reasonable and predictable amount of time.  Equipment Recommendations  Other (comment);Wheelchair (measurements OT);Wheelchair cushion (measurements OT)  (TBD at next venue of care)    Recommendations for Other Services       Precautions / Restrictions Precautions Precautions: Fall Restrictions Weight Bearing Restrictions: Yes LLE Weight Bearing: Non weight bearing      Mobility Bed Mobility Overal bed mobility: Needs Assistance Bed Mobility: Supine to Sit;Sit to Supine;Rolling Rolling: Modified independent (Device/Increase time)   Supine to sit: Modified independent (Device/Increase time);HOB elevated Sit to supine: Modified independent (Device/Increase time);HOB elevated   General bed mobility comments: No assist needed, use of rail. rolling to right/left to adjust pads and using rail.    Transfers Overall transfer level: Needs assistance Equipment used: None               General transfer comment: Worked on laterally scooting along side bed towards chair requiring max cues for technique as pt with tendency to lean too far posteriorly and come forward. Does better scooting towards left than right side. Will attempt OOB on right next session to scoot into chair.      Balance Overall balance assessment: Needs assistance Sitting-balance support: Feet supported;No upper extremity supported Sitting balance-Leahy Scale: Good                                     ADL either performed or assessed with clinical judgement   ADL Overall ADL's : Needs assistance/impaired Eating/Feeding: Independent;Sitting   Grooming: Wash/dry hands;Wash/dry face;Min guard;Sitting   Upper Body Bathing: Min guard;Sitting   Lower Body Bathing: Maximal assistance   Upper Body Dressing : Min guard;Sitting   Lower Body Dressing: Total assistance     Toilet Transfer Details (  indicate cue type and reason): unable this session as attempted with drop arm recliner Toileting- Clothing Manipulation and Hygiene: Total assistance;Bed level         General ADL Comments: pt sat EOB x 10 minutes. attempted to lateral scoot to drop  arm recliner but was unable. pt was able to lateral scoot along side EOB to come cloer to [facility] before returning to supine     Vision Ability to See in Adequate Light: 0 Adequate Patient Visual Report: No change from baseline       Perception     Praxis      Pertinent Vitals/Pain Pain Assessment: No/denies pain     Hand Dominance Right   Extremity/Trunk Assessment Upper Extremity Assessment Upper Extremity Assessment: Generalized weakness   Lower Extremity Assessment Lower Extremity Assessment: Defer to PT evaluation RLE Deficits / Details: Able to perform SLR without difficulty, post surgical deficits RLE RLE Sensation: WNL       Communication Communication Communication: No difficulties   Cognition Arousal/Alertness: Awake/alert Behavior During Therapy: WFL for tasks assessed/performed Overall Cognitive Status: Within Functional Limits for tasks assessed                                       General Comments       Exercises    Shoulder Instructions      Home Living Family/patient expects to be discharged to:: Private residence Living Arrangements: Alone Available Help at Discharge: Family;Available PRN/intermittently Type of Home: Mobile home Home Access: Stairs to enter Entrance Stairs-Number of Steps: 4 Entrance Stairs-Rails: Right;Left Home Layout: One level     Bathroom Shower/Tub: Chief Strategy Officer: Standard     Home Equipment: Rollator (4 wheels)          Prior Functioning/Environment Prior Level of Function : Independent/Modified Independent             Mobility Comments: uses rollater ADLs Comments: was Ind with ADLs/selfcare, IADLs, cooking, was driving, works as a Education officer, community Problem List: Decreased strength;Impaired balance (sitting and/or standing);Decreased activity tolerance;Decreased knowledge of use of DME or AE      OT Treatment/Interventions: Self-care/ADL training;Patient/family  education;Balance training;Therapeutic activities;DME and/or AE instruction    OT Goals(Current goals can be found in the care plan section) Acute Rehab OT Goals Patient Stated Goal: "I need to get better so I can go home" OT Goal Formulation: With patient Time For Goal Achievement: 01/20/21 Potential to Achieve Goals: Good ADL Goals Pt Will Perform Grooming: with supervision;with set-up;sitting Pt Will Perform Upper Body Bathing: with supervision;with set-up;sitting Pt Will Perform Lower Body Bathing: with mod assist;sitting/lateral leans;with adaptive equipment Pt Will Perform Upper Body Dressing: with supervision;with set-up;sitting Pt Will Transfer to Toilet: with max assist;with mod assist;with +2 assist;bedside commode (lateral scoot, drop arm BSC) Pt Will Perform Toileting - Clothing Manipulation and hygiene: with max assist;with mod assist;sitting/lateral leans  OT Frequency: Min 2X/week   Barriers to D/C:            Co-evaluation PT/OT/SLP Co-Evaluation/Treatment: Yes Reason for Co-Treatment: For patient/therapist safety;To address functional/ADL transfers   OT goals addressed during session: ADL's and self-care      AM-PAC OT "6 Clicks" Daily Activity     Outcome Measure Help from another person eating meals?: None Help from another person taking care of personal grooming?: A Little Help from  another person toileting, which includes using toliet, bedpan, or urinal?: Total Help from another person bathing (including washing, rinsing, drying)?: A Lot Help from another person to put on and taking off regular upper body clothing?: A Little Help from another person to put on and taking off regular lower body clothing?: Total 6 Click Score: 14   End of Session    Activity Tolerance: Patient limited by fatigue Patient left: in bed;with call bell/phone within reach;with bed alarm set  OT Visit Diagnosis: Other abnormalities of gait and mobility (R26.89);History of falling  (Z91.81);Muscle weakness (generalized) (M62.81)                Time: 6045-4098 OT Time Calculation (min): 28 min Charges:  OT General Charges $OT Visit: 1 Visit OT Evaluation $OT Eval Moderate Complexity: 1 Mod    Galen Manila 01/06/2021, 4:26 PM

## 2021-01-06 NOTE — Evaluation (Signed)
Physical Therapy Evaluation Patient Details Name: Cindy Ayers MRN: [id number] DOB: [birth year] Today's Date: 01/06/2021  History of Present Illness  Patient is a 60 y/o female who presents with right lateral malleolus nonunion, hardware failure and post traumatic arthritis now s/p removal of deep implant and tibio-talar calcaneal nailing 01/05/21. PMH includes DM-2, HTN, morbid obesity, anxiety, depression and fibromyalgia  Clinical Impression  Patient presents with generalized weakness, post surgical deficits and impaired mobility s/p above. Pt lives at home alone and reports being mod I for ADls/IADLs PTA, using rollator for ambulation. Has a brother that checks on her a few times per week. Today, pt able to perform bed mobility/rolling mod I and assist to scoot along side bed to prepare for transfer to chair. Pt will need to be w/c level at d/c due to NWB status. Instructed pt in there ex, elevation, and discussed post acute rehab. Would benefit from SNF to maximize independence and mobility prior to return home. Will follow acutely.       Recommendations for follow up therapy are one component of a multi-disciplinary discharge planning process, led by the attending physician.  Recommendations may be updated based on patient status, additional functional criteria and insurance authorization.  Follow Up Recommendations Skilled nursing-short term rehab (<3 hours/day)    Assistance Recommended at Discharge Frequent or constant Supervision/Assistance  Functional Status Assessment Patient has had a recent decline in their functional status and demonstrates the ability to make significant improvements in function in a reasonable and predictable amount of time.  Equipment Recommendations  Rolling walker (2 wheels);Wheelchair (measurements PT);BSC/3in1 (drop arm)    Recommendations for Other Services       Precautions / Restrictions Precautions Precautions: Fall Restrictions Weight Bearing  Restrictions: Yes LLE Weight Bearing: Non weight bearing      Mobility  Bed Mobility Overal bed mobility: Needs Assistance Bed Mobility: Supine to Sit;Sit to Supine;Rolling Rolling: Modified independent (Device/Increase time)   Supine to sit: Modified independent (Device/Increase time);HOB elevated Sit to supine: Modified independent (Device/Increase time);HOB elevated   General bed mobility comments: No assist needed, use of rail. rolling to right/left to adjust pads and using rail.    Transfers Overall transfer level: Needs assistance Equipment used: None               General transfer comment: Worked on laterally scooting along side bed towards chair requiring max cues for technique as pt with tendency to lean too far posteriorly and come forward. Does better scooting towards left than right side. Will attempt OOB on right next session to scoot into chair.    Ambulation/Gait               General Gait Details: Unable  Stairs            Wheelchair Mobility    Modified Rankin (Stroke Patients Only)       Balance Overall balance assessment: Needs assistance Sitting-balance support: Feet supported;No upper extremity supported Sitting balance-Leahy Scale: Good                                       Pertinent Vitals/Pain Pain Assessment: No/denies pain    Home Living Family/patient expects to be discharged to:: Private residence Living Arrangements: Alone Available Help at Discharge: Family;Available PRN/intermittently Type of Home: Mobile home Home Access: Stairs to enter Entrance Stairs-Rails: Right;Left Entrance Stairs-Number of Steps: 4   Home Layout:  One level Home Equipment: Rollator (4 wheels)      Prior Function Prior Level of Function : Independent/Modified Independent             Mobility Comments: uses rollater ADLs Comments: was Ind with ADLs/selfcare, IADLs, cooking, was driving, works as a Fish farm manager: Right    Extremity/Trunk Assessment   Upper Extremity Assessment Upper Extremity Assessment: Defer to OT evaluation    Lower Extremity Assessment Lower Extremity Assessment: RLE deficits/detail;Generalized weakness RLE Deficits / Details: Able to perform SLR without difficulty, post surgical deficits RLE RLE Sensation: WNL       Communication   Communication: No difficulties  Cognition Arousal/Alertness: Awake/alert Behavior During Therapy: WFL for tasks assessed/performed Overall Cognitive Status: Within Functional Limits for tasks assessed                                          General Comments      Exercises General Exercises - Lower Extremity Straight Leg Raises: Strengthening;Right;10 reps;Supine   Assessment/Plan    PT Assessment Patient needs continued PT services  PT Problem List Decreased strength;Decreased mobility;Obesity;Decreased balance       PT Treatment Interventions Therapeutic activities;Wheelchair mobility training;Patient/family education;Therapeutic exercise;DME instruction;Balance training;Functional mobility training;Gait training;Modalities    PT Goals (Current goals can be found in the Care Plan section)  Acute Rehab PT Goals Patient Stated Goal: to get better and go home PT Goal Formulation: With patient Time For Goal Achievement: 01/20/21 Potential to Achieve Goals: Good    Frequency Min 3X/week   Barriers to discharge Decreased caregiver support;Inaccessible home environment      Co-evaluation PT/OT/SLP Co-Evaluation/Treatment: Yes Reason for Co-Treatment: For patient/therapist safety;To address functional/ADL transfers           AM-PAC PT "6 Clicks" Mobility  Outcome Measure Help needed turning from your back to your side while in a flat bed without using bedrails?: None Help needed moving from lying on your back to sitting on the side of a flat bed without using  bedrails?: None Help needed moving to and from a bed to a chair (including a wheelchair)?: A Lot Help needed standing up from a chair using your arms (e.g., wheelchair or bedside chair)?: A Lot Help needed to walk in hospital room?: Total Help needed climbing 3-5 steps with a railing? : Total 6 Click Score: 14    End of Session   Activity Tolerance: Patient tolerated treatment well Patient left: in bed;with call bell/phone within reach;with bed alarm set Nurse Communication: Mobility status PT Visit Diagnosis: Difficulty in walking, not elsewhere classified (R26.2);Unsteadiness on feet (R26.81)    Time: 1245-8099 PT Time Calculation (min) (ACUTE ONLY): 27 min   Charges:   PT Evaluation $PT Eval Moderate Complexity: 1 Mod          Vale Haven, PT, DPT Acute Rehabilitation Services Pager [phone] Office [phone]     Blake Divine A Lanier Ensign 01/06/2021, 3:52 PM

## 2021-01-06 NOTE — Progress Notes (Addendum)
Minimal serosanguinous drainage noted from the dressing to right lower leg. Maintained leg elevated and applied ice pack. Will continue to monitor.

## 2021-01-06 NOTE — Progress Notes (Signed)
PROGRESS NOTE  Cindy Ayers KWI:[id number] DOB: Jun 08, 1960   PCP: [facility]  Patient is from: Home  DOA: 01/05/2021 LOS: 1  Chief complaints:  No chief complaint on file.    Brief Narrative / Interim history: 60 year old F with PMH of DM-2, HTN, morbid obesity, anxiety, depression and fibromyalgia admitted by orthopedic surgery for right fibular nonunion.  She underwent ORIF with operative removal of deep implants from the right fibula and ankle arthrodesis on 01/05/2021.  Hospitalist service consulted for medical management including poorly controlled diabetes.   Subjective: Seen and examined earlier this morning.  No major events overnight of this morning.  No complaints.  She denies pain.  Blood sugar remains elevated in 300s.   Objective: Vitals:   01/05/21 2121 01/05/21 2121 01/06/21 0437 01/06/21 0711  BP: (!) 156/70 (!) 156/70 130/60 (!) 135/58  Pulse: 73 72 74 75  Resp:   17 16  Temp: 98.1 F (36.7 C) 98.1 F (36.7 C) 97.6 F (36.4 C) 98.1 F (36.7 C)  TempSrc: Oral Oral Oral Oral  SpO2: 96% 95% 92% 94%  Weight:      Height:        Examination:  GENERAL: No apparent distress.  Nontoxic. HEENT: MMM.  Vision and hearing grossly intact.  NECK: Supple.  No apparent JVD.  RESP:  No IWOB.  Fair aeration bilaterally. CVS:  RRR. Heart sounds normal.  ABD/GI/GU: BS+. Abd soft, NTND.  MSK/EXT:  Moves extremities.  Bulky dressing over RLE. SKIN: no apparent skin lesion or wound NEURO: Awake, alert and oriented appropriately.  No apparent focal neuro deficit. PSYCH: Calm. Normal affect.   Procedures:  01/05/2021-removal of deep implants of right lateral malleolus, tibiotalar calcaneal nailing   Microbiology summarized: None  Assessment & Plan: Uncontrolled DM-2 with hyperglycemia and neuropathy: A1c 7.3%.  On Tresiba 44 units daily at home Recent Labs  Lab 01/05/21 1647 01/05/21 1811 01/05/21 2139 01/06/21 0754 01/06/21 1113   GLUCAP 395* 342* 318* 365* 239*  -Continue basal insulin 44 units daily -Continue SSI-resistant -Added NovoLog 6 units 3 times daily with meals -Started statin. -Continue home gabapentin  Status post ankle arthrodesis -Management per orthopedics/primary   Anxiety/depression: Stable -Continue Paxil, hydroxyzine  Essential hypertension: BP improved. -Discontinue IV fluid -Continue Coreg and losartan  Hyponatremia: Corrects to normal for hyperglycemia.  Hyperkalemia: K5.2. -Lokelma 10 g x 1 -Recheck in the morning  Leukocytosis/bandemia: Likely demargination -Recheck in the morning   Morbid obesity complicated by diabetes Body mass index is 50.4 kg/m.  -Encourage lifestyle change to lose weight.  -Outpatient PCP/bariatric medicine/bariatric surgery f/u encouraged     DVT prophylaxis:  SCDs Start: 01/05/21 1234  Code Status: Full code Family Communication: Patient and/or RN. Available if any question.  Level of care: Med-Surg Status is: Observation  Final disposition: SNF.  Patient can be discharged on current diabetic and antihypertensive regimen from medical standpoint    Sch Meds:  Scheduled Meds:  carvedilol  12.5 mg Oral BID WC   docusate sodium  100 mg Oral BID   gabapentin  900 mg Oral QHS   hydrOXYzine  50 mg Oral QHS   insulin aspart  0-20 Units Subcutaneous TID WC   insulin aspart  0-5 Units Subcutaneous QHS   insulin aspart  6 Units Subcutaneous TID WC   insulin glargine-yfgn  44 Units Subcutaneous Daily   losartan  25 mg Oral Daily   oxybutynin  5 mg Oral BID   PARoxetine  20  mg Oral Daily   senna  1 tablet Oral BID   Continuous Infusions:  methocarbamol (ROBAXIN) IV     PRN Meds:.acetaminophen, diphenhydrAMINE, HYDROmorphone (DILAUDID) injection, methocarbamol **OR** methocarbamol (ROBAXIN) IV, ondansetron **OR** ondansetron (ZOFRAN) IV, oxyCODONE, oxyCODONE  Antimicrobials: Anti-infectives (From admission, onward)    Start     Dose/Rate  Route Frequency Ordered Stop   01/05/21 0955  vancomycin (VANCOCIN) powder  Status:  Discontinued          As needed 01/05/21 0956 01/05/21 1026   01/05/21 0600  ceFAZolin (ANCEF) IVPB 3g/100 mL premix        3 g 200 mL/hr over 30 Minutes Intravenous On call to O.R. 01/05/21 0549 01/05/21 0739        I have personally reviewed the following labs and images: CBC: Recent Labs  Lab 01/04/21 0945 01/06/21 0053  WBC 10.0 13.4*  NEUTROABS  --  12.1*  HGB 12.0 10.9*  HCT 38.9 34.9*  MCV 92.4 91.6  PLT 240 225   BMP &GFR Recent Labs  Lab 01/04/21 0945 01/06/21 0053  NA 136 133*  K 4.4 5.2*  CL 103 104  CO2 27 23  GLUCOSE 173* 294*  BUN 20 24*  CREATININE 1.02* 1.13*  CALCIUM 8.9 8.3*   Estimated Creatinine Clearance: 80.4 mL/min (A) (by C-G formula based on SCr of 1.13 mg/dL (H)). Liver & Pancreas: No results for input(s): AST, ALT, ALKPHOS, BILITOT, PROT, ALBUMIN in the last 168 hours. No results for input(s): LIPASE, AMYLASE in the last 168 hours. No results for input(s): AMMONIA in the last 168 hours. Diabetic: Recent Labs    01/04/21 0945  HGBA1C 7.3*   Recent Labs  Lab 01/05/21 1647 01/05/21 1811 01/05/21 2139 01/06/21 0754 01/06/21 1113  GLUCAP 395* 342* 318* 365* 239*   Cardiac Enzymes: No results for input(s): CKTOTAL, CKMB, CKMBINDEX, TROPONINI in the last 168 hours. No results for input(s): PROBNP in the last 8760 hours. Coagulation Profile: No results for input(s): INR, PROTIME in the last 168 hours. Thyroid Function Tests: No results for input(s): TSH, T4TOTAL, FREET4, T3FREE, THYROIDAB in the last 72 hours. Lipid Profile: No results for input(s): CHOL, HDL, LDLCALC, TRIG, CHOLHDL, LDLDIRECT in the last 72 hours. Anemia Panel: No results for input(s): VITAMINB12, FOLATE, FERRITIN, TIBC, IRON, RETICCTPCT in the last 72 hours. Urine analysis:    Component Value Date/Time   COLORURINE YELLOW 08/18/2015 0305   APPEARANCEUR CLEAR 08/18/2015 0305    LABSPEC 1.010 08/18/2015 0305   PHURINE 7.0 08/18/2015 0305   GLUCOSEU 100 (A) 08/18/2015 0305   HGBUR SMALL (A) 08/18/2015 0305   BILIRUBINUR NEGATIVE 08/18/2015 0305   KETONESUR NEGATIVE 08/18/2015 0305   PROTEINUR 30 (A) 08/18/2015 0305   NITRITE NEGATIVE 08/18/2015 0305   LEUKOCYTESUR NEGATIVE 08/18/2015 0305   Sepsis Labs: Invalid input(s): PROCALCITONIN, LACTICIDVEN  Microbiology: Recent Results (from the past 240 hour(s))  SARS CORONAVIRUS 2 (TAT 6-24 HRS) Nasopharyngeal Nasopharyngeal Swab     Status: None   Collection Time: 01/04/21  9:45 AM   Specimen: Nasopharyngeal Swab  Result Value Ref Range Status   SARS Coronavirus 2 NEGATIVE NEGATIVE Final    Comment: (NOTE) SARS-CoV-2 target nucleic acids are NOT DETECTED.  The SARS-CoV-2 RNA is generally detectable in upper and lower respiratory specimens during the acute phase of infection. Negative results do not preclude SARS-CoV-2 infection, do not rule out co-infections with other pathogens, and should not be used as the sole basis for treatment or other patient management decisions. Negative  results must be combined with clinical observations, patient history, and epidemiological information. The expected result is Negative.  Fact Sheet for Patients: HairSlick.no  Fact Sheet for Healthcare Providers: quierodirigir.com  This test is not yet approved or cleared by the [place] FDA and  has been authorized for detection and/or diagnosis of SARS-CoV-2 by FDA under an Emergency Use Authorization (EUA). This EUA will remain  in effect (meaning this test can be used) for the duration of the COVID-19 declaration under Se ction 564(b)(1) of the Act, 21 U.S.C. section 360bbb-3(b)(1), unless the authorization is terminated or revoked sooner.  Performed at [facility], [address] [place], [place] [address]   Surgical pcr screen     Status: Abnormal    Collection Time: 01/04/21 10:09 AM   Specimen: Nasal Mucosa; Nasal Swab  Result Value Ref Range Status   MRSA, PCR NEGATIVE NEGATIVE Final   Staphylococcus aureus POSITIVE (A) NEGATIVE Final    Comment: (NOTE) The Xpert SA Assay (FDA approved for NASAL specimens in patients 56 years of age and older), is one component of a comprehensive surveillance program. It is not intended to diagnose infection nor to guide or monitor treatment. Performed at [facility], [address] [place], [place] [address]     Radiology Studies: No results found.    Nathaniel Wakeley T. Darcelle Herrada [facility]  If 7PM-7AM, please contact night-coverage [email or web address] 01/06/2021, 1:01 PM

## 2021-01-06 NOTE — Progress Notes (Signed)
Subjective: 1 Day Post-Op Procedure(s) (LRB): Removal deep implants right lateral malleolus; tibio-talar calcaneal nailing from a lateral approach (Right)  Patient reports pain as mild to moderate.  Denies fever, chills, N/V, CP, SOB.  Tolerating POs well.  Admits to flatus.  Objective:   VITALS:  Temp:  [97.3 F (36.3 C)-98.1 F (36.7 C)] 98.1 F (36.7 C) (12/23 0711) Pulse Rate:  [69-84] 75 (12/23 0711) Resp:  [9-19] 16 (12/23 0711) BP: (130-186)/(58-106) 135/58 (12/23 0711) SpO2:  [85 %-100 %] 94 % (12/23 0711)  General: WDWN patient in NAD. Psych:  Appropriate mood and affect. Neuro:  A&O x 3, Moving all extremities, sensation intact to light touch HEENT:  EOMs intact Chest:  Even non-labored respirations Skin:  SLS C/D/I, no rashes or lesions.  Evidence of scant dried serosanguinous drainage on splint. Extremities: warm/dry, no visible edema, erythema or echymosis.  No lymphadenopathy. Pulses: Popliteus 2+ MSK:  ROM: EHL/FHL intact, MMT: able to perform quad set   LABS Recent Labs    01/04/21 0945 01/06/21 0053  HGB 12.0 10.9*  WBC 10.0 13.4*  PLT 240 225   Recent Labs    01/04/21 0945 01/06/21 0053  NA 136 133*  K 4.4 5.2*  CL 103 104  CO2 27 23  BUN 20 24*  CREATININE 1.02* 1.13*  GLUCOSE 173* 294*   No results for input(s): LABPT, INR in the last 72 hours.   Assessment/Plan: 1 Day Post-Op Procedure(s) (LRB): Removal deep implants right lateral malleolus; tibio-talar calcaneal nailing from a lateral approach (Right)  NWB R LE Up with therapy DVT ppx:  Lovenox in house; transition to Xarelto upon D/C Disp: likely SNF D/C scripts on chart.  After searching [place] PMP Aware the patient is provided a Rx for oxycodone. Plan for 2 week outpatient post-op visit.  Alfredo Martinez PA-C [facility] Office:  [phone]

## 2021-01-07 DIAGNOSIS — S8262XA Displaced fracture of lateral malleolus of left fibula, initial encounter for closed fracture: Secondary | ICD-10-CM | POA: Diagnosis not present

## 2021-01-07 DIAGNOSIS — E1165 Type 2 diabetes mellitus with hyperglycemia: Secondary | ICD-10-CM | POA: Diagnosis not present

## 2021-01-07 DIAGNOSIS — F419 Anxiety disorder, unspecified: Secondary | ICD-10-CM | POA: Diagnosis not present

## 2021-01-07 DIAGNOSIS — Z981 Arthrodesis status: Secondary | ICD-10-CM | POA: Diagnosis not present

## 2021-01-07 DIAGNOSIS — F32A Depression, unspecified: Secondary | ICD-10-CM

## 2021-01-07 DIAGNOSIS — D509 Iron deficiency anemia, unspecified: Secondary | ICD-10-CM | POA: Diagnosis not present

## 2021-01-07 LAB — IRON AND TIBC
Iron: 50 ug/dL (ref 28–170)
Saturation Ratios: 16 % (ref 10.4–31.8)
TIBC: 308 ug/dL (ref 250–450)
UIBC: 258 ug/dL

## 2021-01-07 LAB — RETICULOCYTES
Immature Retic Fract: 27.3 % — ABNORMAL HIGH (ref 2.3–15.9)
RBC.: 3.32 MIL/uL — ABNORMAL LOW (ref 3.87–5.11)
Retic Count, Absolute: 91.6 10*3/uL (ref 19.0–186.0)
Retic Ct Pct: 2.8 % (ref 0.4–3.1)

## 2021-01-07 LAB — RENAL FUNCTION PANEL
Albumin: 2.4 g/dL — ABNORMAL LOW (ref 3.5–5.0)
Anion gap: 8 (ref 5–15)
BUN: 23 mg/dL — ABNORMAL HIGH (ref 6–20)
CO2: 24 mmol/L (ref 22–32)
Calcium: 8.3 mg/dL — ABNORMAL LOW (ref 8.9–10.3)
Chloride: 105 mmol/L (ref 98–111)
Creatinine, Ser: 1.03 mg/dL — ABNORMAL HIGH (ref 0.44–1.00)
GFR, Estimated: >60 mL/min (ref >60)
Glucose, Bld: 181 mg/dL — ABNORMAL HIGH (ref 70–99)
Phosphorus: 3.7 mg/dL (ref 2.5–4.6)
Potassium: 4.6 mmol/L (ref 3.5–5.1)
Sodium: 137 mmol/L (ref 135–145)

## 2021-01-07 LAB — CBC
HCT: 30 % — ABNORMAL LOW (ref 36.0–46.0)
Hemoglobin: 9.4 g/dL — ABNORMAL LOW (ref 12.0–15.0)
MCH: 28.7 pg (ref 26.0–34.0)
MCHC: 31.3 g/dL (ref 30.0–36.0)
MCV: 91.5 fL (ref 80.0–100.0)
Platelets: 190 10*3/uL (ref 150–400)
RBC: 3.28 MIL/uL — ABNORMAL LOW (ref 3.87–5.11)
RDW: 14.7 % (ref 11.5–15.5)
WBC: 10.6 10*3/uL — ABNORMAL HIGH (ref 4.0–10.5)
nRBC: 0 % (ref 0.0–0.2)

## 2021-01-07 LAB — MAGNESIUM: Magnesium: 2.1 mg/dL (ref 1.7–2.4)

## 2021-01-07 LAB — GLUCOSE, CAPILLARY
Glucose-Capillary: 114 mg/dL — ABNORMAL HIGH (ref 70–99)
Glucose-Capillary: 126 mg/dL — ABNORMAL HIGH (ref 70–99)
Glucose-Capillary: 120 mg/dL — ABNORMAL HIGH (ref 70–99)
Glucose-Capillary: 166 mg/dL — ABNORMAL HIGH (ref 70–99)

## 2021-01-07 LAB — VITAMIN B12: Vitamin B-12: 579 pg/mL (ref 180–914)

## 2021-01-07 LAB — FERRITIN: Ferritin: 25 ng/mL (ref 11–307)

## 2021-01-07 LAB — FOLATE: Folate: 7.1 ng/mL (ref >5.9)

## 2021-01-07 MED ORDER — FERROUS SULFATE 325 (65 FE) MG PO TABS
325.0000 mg | ORAL_TABLET | Freq: Two times a day (BID) | ORAL | Status: DC
  Administered 2021-01-08 – 2021-01-11 (×7): 325 mg via ORAL
  Filled 2021-01-07 (×7): qty 1

## 2021-01-07 MED ORDER — SODIUM CHLORIDE 0.9 % IV SOLN
250.0000 mg | Freq: Once | INTRAVENOUS | Status: AC
  Administered 2021-01-07: 13:00:00 250 mg via INTRAVENOUS
  Filled 2021-01-07: qty 20

## 2021-01-07 NOTE — Progress Notes (Signed)
° ° °  Subjective: 2 Days Post-Op Procedure(s) (LRB): Removal deep implants right lateral malleolus; tibio-talar calcaneal nailing from a lateral approach (Right) Patient reports pain as 1 on 0-10 scale.   Denies CP or SOB.  Voiding without difficulty. Positive flatus. Objective: Vital signs in last 24 hours: Temp:  [97.8 F (36.6 C)-98.2 F (36.8 C)] 98.2 F (36.8 C) (12/24 0722) Pulse Rate:  [65-75] 68 (12/24 0722) Resp:  [16-17] 16 (12/24 0722) BP: (93-145)/(58-82) 145/82 (12/24 0722) SpO2:  [92 %-96 %] 96 % (12/24 0722)  Intake/Output from previous day: 12/23 0701 - 12/24 0700 In: -  Out: 700 [Urine:700] Intake/Output this shift: No intake/output data recorded.  Labs: Recent Labs    01/04/21 0945 01/06/21 0053 01/07/21 0045  HGB 12.0 10.9* 9.4*   Recent Labs    01/06/21 0053 01/07/21 0045  WBC 13.4* 10.6*  RBC 3.81* 3.28*  HCT 34.9* 30.0*  PLT 225 190   Recent Labs    01/06/21 0053 01/07/21 0045  NA 133* 137  K 5.2* 4.6  CL 104 105  CO2 23 24  BUN 24* 23*  CREATININE 1.13* 1.03*  GLUCOSE 294* 181*  CALCIUM 8.3* 8.3*   No results for input(s): LABPT, INR in the last 72 hours.  Physical Exam: ABD soft Sensation intact distally No cellulitis present Compartment soft Body mass index is 50.4 kg/m.   Assessment/Plan: 2 Days Post-Op Procedure(s) (LRB): Removal deep implants right lateral malleolus; tibio-talar calcaneal nailing from a lateral approach (Right) Advance diet Up with therapy Discharge to SNF when bed is ready No new recommendations   Alvy Beal for Dr. Venita Lick [facility] [phone] 01/07/2021, 8:19 AM

## 2021-01-07 NOTE — Progress Notes (Signed)
PROGRESS NOTE  Braeley Buskey GLO:[id number] DOB: Jan 11, 1961   PCP: [facility]  Patient is from: Home  DOA: 01/05/2021 LOS: 1  Chief complaints:  No chief complaint on file.    Brief Narrative / Interim history: 60 year old F with PMH of DM-2, HTN, morbid obesity, anxiety, depression and fibromyalgia admitted by orthopedic surgery for right fibular nonunion.  She underwent ORIF with operative removal of deep implants from the right fibula and ankle arthrodesis on 01/05/2021.  Hospitalist service consulted for medical management including poorly controlled diabetes.  Hyperglycemia improved.  Subjective: Seen and examined earlier this morning.  No major events overnight of this morning.  No complaints.  Hyperglycemia improved.  Pain fairly controlled.  Objective: Vitals:   01/06/21 1555 01/06/21 1933 01/07/21 0421 01/07/21 0722  BP: 93/70 (!) 132/58 (!) 144/66 (!) 145/82  Pulse: 75 73 65 68  Resp: 16 17 17 16   Temp: 98 F (36.7 C) 97.8 F (36.6 C) 98.1 F (36.7 C) 98.2 F (36.8 C)  TempSrc: Oral Oral Oral Oral  SpO2: 94% 92% 96% 96%  Weight:      Height:        Examination:  GENERAL: No apparent distress.  Nontoxic. HEENT: MMM.  Vision and hearing grossly intact.  NECK: Supple.  No apparent JVD.  RESP: 96% on RA.  No IWOB.  Fair aeration bilaterally. CVS:  RRR. Heart sounds normal.  ABD/GI/GU: BS+. Abd soft, NTND.  MSK/EXT:  Moves extremities.  Bulky dressing over RLE. SKIN: no apparent skin lesion or wound NEURO: Awake and alert. Oriented appropriately.  No apparent focal neuro deficit. PSYCH: Calm. Normal affect.   Procedures:  01/05/2021-removal of deep implants of right lateral malleolus, tibiotalar calcaneal nailing   Microbiology summarized: None  Assessment & Plan: Uncontrolled DM-2 with hyperglycemia and neuropathy: A1c 7.3%.  On Tresiba 44 units daily at home Recent Labs  Lab 01/06/21 1113 01/06/21 1622 01/06/21 2049  01/07/21 0806 01/07/21 1123  GLUCAP 239* 112* 205* 114* 126*  -Continue basal insulin 44 units daily -Continue SSI-resistant -Continue NovoLog 6 units 3 times daily with meals -Started atorvastatin. -Continue home gabapentin  Status post ankle arthrodesis -Management per orthopedics/primary  Normocytic anemia: Slight drop in Hgb likely surgical blood loss and dilutional from IV fluid.  Anemia panel with low ferritin suggesting some degree of iron deficiency. Recent Labs    01/04/21 0945 01/06/21 0053 01/07/21 0045  HGB 12.0 10.9* 9.4*  -IV ferric gluconate 250 mg x 1 -P.o. ferrous sulfate on discharge   Anxiety/depression: Stable -Continue Paxil, hydroxyzine  Essential hypertension: BP improved. -Continue Coreg and losartan  Hyponatremia: Likely due to hyperglycemia.  Resolved.  Hyperkalemia: Resolved.  Leukocytosis/bandemia: Likely demargination.  Improved. -Recheck in the morning   Morbid obesity complicated by diabetes Body mass index is 50.4 kg/m.  -Encourage lifestyle change to lose weight.  -Outpatient PCP/bariatric medicine/bariatric surgery f/u encouraged     DVT prophylaxis:  SCDs Start: 01/05/21 1234  Code Status: Full code Family Communication: Patient and/or RN. Available if any question.  Level of care: Med-Surg Status is: Observation  Final disposition: SNF per primary.  Patient can be discharged on current diabetic and antihypertensive regimen from medical standpoint    Sch Meds:  Scheduled Meds:  atorvastatin  20 mg Oral Daily   carvedilol  12.5 mg Oral BID WC   docusate sodium  100 mg Oral BID   gabapentin  900 mg Oral QHS   hydrOXYzine  50 mg Oral QHS  insulin aspart  0-20 Units Subcutaneous TID WC   insulin aspart  0-5 Units Subcutaneous QHS   insulin aspart  6 Units Subcutaneous TID WC   insulin glargine-yfgn  44 Units Subcutaneous Daily   losartan  25 mg Oral Daily   oxybutynin  5 mg Oral BID   PARoxetine  20 mg Oral Daily    senna  1 tablet Oral BID   Continuous Infusions:  methocarbamol (ROBAXIN) IV     PRN Meds:.acetaminophen, diphenhydrAMINE, HYDROmorphone (DILAUDID) injection, methocarbamol **OR** methocarbamol (ROBAXIN) IV, ondansetron **OR** ondansetron (ZOFRAN) IV, oxyCODONE, oxyCODONE  Antimicrobials: Anti-infectives (From admission, onward)    Start     Dose/Rate Route Frequency Ordered Stop   01/05/21 0955  vancomycin (VANCOCIN) powder  Status:  Discontinued          As needed 01/05/21 0956 01/05/21 1026   01/05/21 0600  ceFAZolin (ANCEF) IVPB 3g/100 mL premix        3 g 200 mL/hr over 30 Minutes Intravenous On call to O.R. 01/05/21 0549 01/05/21 0739        I have personally reviewed the following labs and images: CBC: Recent Labs  Lab 01/04/21 0945 01/06/21 0053 01/07/21 0045  WBC 10.0 13.4* 10.6*  NEUTROABS  --  12.1*  --   HGB 12.0 10.9* 9.4*  HCT 38.9 34.9* 30.0*  MCV 92.4 91.6 91.5  PLT 240 225 190   BMP &GFR Recent Labs  Lab 01/04/21 0945 01/06/21 0053 01/07/21 0045  NA 136 133* 137  K 4.4 5.2* 4.6  CL 103 104 105  CO2 27 23 24   GLUCOSE 173* 294* 181*  BUN 20 24* 23*  CREATININE 1.02* 1.13* 1.03*  CALCIUM 8.9 8.3* 8.3*  MG  --   --  2.1  PHOS  --   --  3.7   Estimated Creatinine Clearance: 88.2 mL/min (A) (by C-G formula based on SCr of 1.03 mg/dL (H)). Liver & Pancreas: Recent Labs  Lab 01/07/21 0045  ALBUMIN 2.4*   No results for input(s): LIPASE, AMYLASE in the last 168 hours. No results for input(s): AMMONIA in the last 168 hours. Diabetic: No results for input(s): HGBA1C in the last 72 hours.  Recent Labs  Lab 01/06/21 1113 01/06/21 1622 01/06/21 2049 01/07/21 0806 01/07/21 1123  GLUCAP 239* 112* 205* 114* 126*   Cardiac Enzymes: No results for input(s): CKTOTAL, CKMB, CKMBINDEX, TROPONINI in the last 168 hours. No results for input(s): PROBNP in the last 8760 hours. Coagulation Profile: No results for input(s): INR, PROTIME in the last  168 hours. Thyroid Function Tests: No results for input(s): TSH, T4TOTAL, FREET4, T3FREE, THYROIDAB in the last 72 hours. Lipid Profile: No results for input(s): CHOL, HDL, LDLCALC, TRIG, CHOLHDL, LDLDIRECT in the last 72 hours. Anemia Panel: Recent Labs    01/07/21 0940  VITAMINB12 579  FOLATE 7.1  FERRITIN 25  TIBC 308  IRON 50  RETICCTPCT 2.8   Urine analysis:    Component Value Date/Time   COLORURINE YELLOW 08/18/2015 0305   APPEARANCEUR CLEAR 08/18/2015 0305   LABSPEC 1.010 08/18/2015 0305   PHURINE 7.0 08/18/2015 0305   GLUCOSEU 100 (A) 08/18/2015 0305   HGBUR SMALL (A) 08/18/2015 0305   BILIRUBINUR NEGATIVE 08/18/2015 0305   KETONESUR NEGATIVE 08/18/2015 0305   PROTEINUR 30 (A) 08/18/2015 0305   NITRITE NEGATIVE 08/18/2015 0305   LEUKOCYTESUR NEGATIVE 08/18/2015 0305   Sepsis Labs: Invalid input(s): PROCALCITONIN, LACTICIDVEN  Microbiology: Recent Results (from the past 240 hour(s))  SARS CORONAVIRUS 2 (  TAT 6-24 HRS) Nasopharyngeal Nasopharyngeal Swab     Status: None   Collection Time: 01/04/21  9:45 AM   Specimen: Nasopharyngeal Swab  Result Value Ref Range Status   SARS Coronavirus 2 NEGATIVE NEGATIVE Final    Comment: (NOTE) SARS-CoV-2 target nucleic acids are NOT DETECTED.  The SARS-CoV-2 RNA is generally detectable in upper and lower respiratory specimens during the acute phase of infection. Negative results do not preclude SARS-CoV-2 infection, do not rule out co-infections with other pathogens, and should not be used as the sole basis for treatment or other patient management decisions. Negative results must be combined with clinical observations, patient history, and epidemiological information. The expected result is Negative.  Fact Sheet for Patients: HairSlick.no  Fact Sheet for Healthcare Providers: quierodirigir.com  This test is not yet approved or cleared by the [place] FDA and   has been authorized for detection and/or diagnosis of SARS-CoV-2 by FDA under an Emergency Use Authorization (EUA). This EUA will remain  in effect (meaning this test can be used) for the duration of the COVID-19 declaration under Se ction 564(b)(1) of the Act, 21 U.S.C. section 360bbb-3(b)(1), unless the authorization is terminated or revoked sooner.  Performed at [facility], [address], [place], [place] [address]   Surgical pcr screen     Status: Abnormal   Collection Time: 01/04/21 10:09 AM   Specimen: Nasal Mucosa; Nasal Swab  Result Value Ref Range Status   MRSA, PCR NEGATIVE NEGATIVE Final   Staphylococcus aureus POSITIVE (A) NEGATIVE Final    Comment: (NOTE) The Xpert SA Assay (FDA approved for NASAL specimens in patients 24 years of age and older), is one component of a comprehensive surveillance program. It is not intended to diagnose infection nor to guide or monitor treatment. Performed at [facility], [address], [place], [place] [address]     Radiology Studies: No results found.    Tyren Dugar T. Sheika Coutts [facility]  If 7PM-7AM, please contact night-coverage [email or web address] 01/07/2021, 3:10 PM

## 2021-01-08 DIAGNOSIS — E119 Type 2 diabetes mellitus without complications: Secondary | ICD-10-CM

## 2021-01-08 DIAGNOSIS — S8262XA Displaced fracture of lateral malleolus of left fibula, initial encounter for closed fracture: Secondary | ICD-10-CM | POA: Diagnosis not present

## 2021-01-08 DIAGNOSIS — Z981 Arthrodesis status: Secondary | ICD-10-CM | POA: Diagnosis not present

## 2021-01-08 DIAGNOSIS — I10 Essential (primary) hypertension: Secondary | ICD-10-CM | POA: Diagnosis not present

## 2021-01-08 LAB — CBC
HCT: 35.2 % — ABNORMAL LOW (ref 36.0–46.0)
Hemoglobin: 10.5 g/dL — ABNORMAL LOW (ref 12.0–15.0)
MCH: 28.2 pg (ref 26.0–34.0)
MCHC: 29.8 g/dL — ABNORMAL LOW (ref 30.0–36.0)
MCV: 94.4 fL (ref 80.0–100.0)
Platelets: 224 10*3/uL (ref 150–400)
RBC: 3.73 MIL/uL — ABNORMAL LOW (ref 3.87–5.11)
RDW: 15.4 % (ref 11.5–15.5)
WBC: 11.3 10*3/uL — ABNORMAL HIGH (ref 4.0–10.5)
nRBC: 0.8 % — ABNORMAL HIGH (ref 0.0–0.2)

## 2021-01-08 LAB — RENAL FUNCTION PANEL
Albumin: 2.8 g/dL — ABNORMAL LOW (ref 3.5–5.0)
Anion gap: 8 (ref 5–15)
BUN: 22 mg/dL — ABNORMAL HIGH (ref 6–20)
CO2: 27 mmol/L (ref 22–32)
Calcium: 8.8 mg/dL — ABNORMAL LOW (ref 8.9–10.3)
Chloride: 104 mmol/L (ref 98–111)
Creatinine, Ser: 1.28 mg/dL — ABNORMAL HIGH (ref 0.44–1.00)
GFR, Estimated: 49 mL/min — ABNORMAL LOW (ref >60)
Glucose, Bld: 99 mg/dL (ref 70–99)
Phosphorus: 5.2 mg/dL — ABNORMAL HIGH (ref 2.5–4.6)
Potassium: 4.2 mmol/L (ref 3.5–5.1)
Sodium: 139 mmol/L (ref 135–145)

## 2021-01-08 LAB — GLUCOSE, CAPILLARY
Glucose-Capillary: 109 mg/dL — ABNORMAL HIGH (ref 70–99)
Glucose-Capillary: 126 mg/dL — ABNORMAL HIGH (ref 70–99)
Glucose-Capillary: 128 mg/dL — ABNORMAL HIGH (ref 70–99)
Glucose-Capillary: 132 mg/dL — ABNORMAL HIGH (ref 70–99)
Glucose-Capillary: 147 mg/dL — ABNORMAL HIGH (ref 70–99)

## 2021-01-08 LAB — MAGNESIUM: Magnesium: 2 mg/dL (ref 1.7–2.4)

## 2021-01-08 NOTE — [place] FL2 (Signed)
[place] MEDICAID FL2 LEVEL OF CARE SCREENING TOOL     IDENTIFICATION  Patient Name: Cindy Ayers Birthdate: 19-Aug-1960 Sex: female Admission Date (Current Location): 01/05/2021  [facility] and [place] Number:  Producer, television/film/video and Address:  [facility], [address] [place], [place] [address]      Provider Number: [id number]  Attending Physician Name and Address:  Toni Arthurs, MD  Relative Name and Phone Number:  Kristie Cowman, [phone]    Current Level of Care: Hospital Recommended Level of Care: Skilled Nursing Facility Prior Approval Number:    Date Approved/Denied:   PASRR Number: Level 2  Discharge Plan: SNF    Current Diagnoses: Patient Active Problem List   Diagnosis Date Noted   Status post ankle arthrodesis 01/05/2021   Diabetes mellitus without complication (HCC) 01/05/2021   Hypertension 01/05/2021   Morbid obesity with BMI of 50.0-59.9, adult (HCC) 01/05/2021   Depression with anxiety 01/05/2021   SOB (shortness of breath) 08/18/2015    Orientation RESPIRATION BLADDER Height & Weight     Self, Time, Situation, Place  Normal Incontinent, External catheter Weight: (!) 317 lb (143.8 kg) Height:  5' 6.5" (168.9 cm)  BEHAVIORAL SYMPTOMS/MOOD NEUROLOGICAL BOWEL NUTRITION STATUS      Continent Diet (See DC summary)  AMBULATORY STATUS COMMUNICATION OF NEEDS Skin   Extensive Assist Verbally Surgical wounds (R leg incision w/ dressing)                       Personal Care Assistance Level of Assistance  Bathing, Feeding, Dressing Bathing Assistance: Maximum assistance Feeding assistance: Independent Dressing Assistance: Maximum assistance     Functional Limitations Info  Sight, Hearing, Speech Sight Info: Adequate Hearing Info: Adequate Speech Info: Adequate    SPECIAL CARE FACTORS FREQUENCY  PT (By licensed PT), OT (By licensed OT)     PT Frequency: 5x week OT Frequency: 5x week             Contractures Contractures Info: Not present    Additional Factors Info  Code Status, Allergies, Insulin Sliding Scale, Psychotropic Code Status Info: Full Allergies Info: Ethylenediamine   Antihistamines, Chlorpheniramine-type Psychotropic Info: Paxil, Hydroxozine Insulin Sliding Scale Info: Insulin Aspart (Novolog), Insulin Glargine- YFGN (Semglee) See DC summary for dosing.       Current Medications (01/08/2021):  This is the current hospital active medication list Current Facility-Administered Medications  Medication Dose Route Frequency Provider Last Rate Last Admin   acetaminophen (TYLENOL) tablet 325-650 mg  325-650 mg Oral Q6H PRN Jacinta Shoe, PA-C   650 mg at 01/06/21 1313   atorvastatin (LIPITOR) tablet 20 mg  20 mg Oral Daily Candelaria Stagers T, MD   20 mg at 01/08/21 0931   carvedilol (COREG) tablet 12.5 mg  12.5 mg Oral BID WC Jacinta Shoe, PA-C   12.5 mg at 01/08/21 0745   diphenhydrAMINE (BENADRYL) 12.5 MG/5ML elixir 12.5-25 mg  12.5-25 mg Oral Q4H PRN Jacinta Shoe, PA-C       docusate sodium (COLACE) capsule 100 mg  100 mg Oral BID Jacinta Shoe, PA-C   100 mg at 01/08/21 2094   ferrous sulfate tablet 325 mg  325 mg Oral BID WC Candelaria Stagers T, MD   325 mg at 01/08/21 0745   gabapentin (NEURONTIN) capsule 900 mg  900 mg Oral QHS Jacinta Shoe, PA-C   900 mg at 01/07/21 2119   HYDROmorphone (DILAUDID) injection 0.5-1 mg  0.5-1  mg Intravenous Q4H PRN Jacinta Shoe, PA-C   1 mg at 01/08/21 5009   hydrOXYzine (ATARAX) tablet 50 mg  50 mg Oral QHS Jacinta Shoe, [place]   50 mg at 01/07/21 2119   insulin aspart (novoLOG) injection 0-20 Units  0-20 Units Subcutaneous TID WC Almon Hercules, MD   3 Units at 01/08/21 1253   insulin aspart (novoLOG) injection 0-5 Units  0-5 Units Subcutaneous QHS Candelaria Stagers T, MD   2 Units at 01/06/21 2137   insulin aspart (novoLOG) injection 6 Units  6 Units Subcutaneous TID WC Almon Hercules, MD   6 Units at 01/08/21  1253   insulin glargine-yfgn (SEMGLEE) injection 44 Units  44 Units Subcutaneous Daily Jonah Blue, MD   44 Units at 01/08/21 0931   losartan (COZAAR) tablet 25 mg  25 mg Oral Daily Jacinta Shoe, PA-C   25 mg at 01/08/21 3818   methocarbamol (ROBAXIN) tablet 500 mg  500 mg Oral Q6H PRN Jacinta Shoe, PA-C   500 mg at 01/08/21 2993   Or   methocarbamol (ROBAXIN) 500 mg in dextrose 5 % 50 mL IVPB  500 mg Intravenous Q6H PRN Jacinta Shoe, PA-C       ondansetron [facility]) tablet 4 mg  4 mg Oral Q6H PRN Jacinta Shoe, PA-C       Or   ondansetron [facility]) injection 4 mg  4 mg Intravenous Q6H PRN Jacinta Shoe, PA-C       oxybutynin (DITROPAN) tablet 5 mg  5 mg Oral BID Jacinta Shoe, PA-C   5 mg at 01/08/21 7169   oxyCODONE (Oxy IR/ROXICODONE) immediate release tablet 10-15 mg  10-15 mg Oral Q4H PRN Jacinta Shoe, PA-C   15 mg at 01/08/21 0930   oxyCODONE (Oxy IR/ROXICODONE) immediate release tablet 5-10 mg  5-10 mg Oral Q4H PRN Jacinta Shoe, PA-C   5 mg at 01/07/21 1208   PARoxetine (PAXIL) tablet 20 mg  20 mg Oral Daily Jacinta Shoe, PA-C   20 mg at 01/08/21 6789   senna (SENOKOT) tablet 8.6 mg  1 tablet Oral BID Jacinta Shoe, PA-C   8.6 mg at 01/08/21 3810     Discharge Medications: Please see discharge summary for a list of discharge medications.  Relevant Imaging Results:  Relevant Lab Results:   Additional Information SS# [id number] [place]

## 2021-01-08 NOTE — Plan of Care (Signed)
Problem: Education: Goal: Knowledge of General Education information will improve Description: Including pain rating scale, medication(s)/side effects and non-pharmacologic comfort measures Outcome: Completed/Met   Problem: Health Behavior/Discharge Planning: Goal: Ability to manage health-related needs will improve Outcome: Progressing   Problem: Clinical Measurements: Goal: Ability to maintain clinical measurements within normal limits will improve Outcome: Progressing   Problem: Activity: Goal: Risk for activity intolerance will decrease Outcome: Progressing   Problem: Nutrition: Goal: Adequate nutrition will be maintained Outcome: Completed/Met   Problem: Coping: Goal: Level of anxiety will decrease Outcome: Progressing   Problem: Elimination: Goal: Will not experience complications related to bowel motility Outcome: Progressing   Problem: Pain Managment: Goal: General experience of comfort will improve Outcome: Progressing   Problem: Safety: Goal: Ability to remain free from injury will improve Outcome: Progressing   Problem: Skin Integrity: Goal: Risk for impaired skin integrity will decrease Outcome: Progressing

## 2021-01-08 NOTE — Social Work (Addendum)
Please be advised that the above-named patient will require a short-term nursing home stay-anticipated 30 days or less for rehabilitation and strengthening. The plan is for return home.  

## 2021-01-08 NOTE — TOC Initial Note (Addendum)
Transition of Care [facility]) - Initial/Assessment Note    Patient Details  Name: Cindy Ayers MRN: [id number] Date of Birth: 07-19-1960  Transition of [facility] CM/SW Contact:    Bary Castilla, LCSW Phone Number: [phone] 01/08/2021, 1:22 PM  Clinical Narrative:          CSW met with patient to discuss PT recommendation of a SNF. Patient was aware of recommendation and in agreement with going to a ST SNF. CSW discussed the SNF process.CSW provided patient with medicare.gov rating list.  Patient gave CSW permission to fax referrals out to local facilities.CSW answered questions about the SNF process and the next steps in the process. Pt is requesting to be at a facility near her home address. Pt has been vaccinated and boostered. Pt had asked about going to a SNF in [place]. CSW explained that it would be out of the range for transportation to transport without an upfront cost. Pt explained that she was fine with going to facilities in [place] or [place]. Pt expressed that she did not want to go to [facility] or facilities in [place].  TOC team will continue to assist with discharge planning needs.               Expected Discharge Plan: Skilled Nursing Facility Barriers to Discharge: Ship broker, Continued Medical Work up, SNF Pending bed offer   Patient Goals and CMS Choice Patient states their goals for this hospitalization and ongoing recovery are:: TO be able to return home CMS Medicare.gov Compare Post Acute Care list provided to:: Patient Choice offered to / list presented to : Patient  Expected Discharge Plan and Services Expected Discharge Plan: [place]       Living arrangements for the past 2 months: Mobile Home                                      Prior Living Arrangements/Services Living arrangements for the past 2 months: Mobile Home Lives with:: Self Patient language and need for interpreter reviewed:: Yes                  Activities of Daily Living Home Assistive Devices/Equipment: Environmental consultant (specify type) ADL Screening (condition at time of admission) Patient's cognitive ability adequate to safely complete daily activities?: Yes Is the patient deaf or have difficulty hearing?: No Does the patient have difficulty seeing, even when wearing glasses/contacts?: Yes Does the patient have difficulty concentrating, remembering, or making decisions?: Yes Patient able to express need for assistance with ADLs?: Yes Does the patient have difficulty dressing or bathing?: No Independently performs ADLs?: Yes (appropriate for developmental age) Does the patient have difficulty walking or climbing stairs?: Yes Weakness of Legs: Right Weakness of Arms/Hands: None  Permission Sought/Granted      Share Information with NAME: Marjory Lies  Permission granted to share info w AGENCY: SNFs  Permission granted to share info w Relationship: brother  Permission granted to share info w Contact Information: [phone]  Emotional Assessment Appearance:: Appears stated age Attitude/Demeanor/Rapport: Engaged Affect (typically observed): Accepting, Adaptable Orientation: : Oriented to Self, Oriented to Place, Oriented to  Time, Oriented to Situation      Admission diagnosis:  Status post ankle arthrodesis [Z98.1] Patient Active Problem List   Diagnosis Date Noted   Status post ankle arthrodesis 01/05/2021   Diabetes mellitus without complication ([place]) 56/81/2751   Hypertension 01/05/2021  Morbid obesity with BMI of 50.0-59.9, adult ([place]) 01/05/2021   Depression with anxiety 01/05/2021   SOB (shortness of breath) 08/18/2015   PCP:  [facility]   [facility] [place], [address] [place] [address] Phone: [phone] Fax: [phone]     Social Determinants of Health (SDOH) Interventions    Readmission Risk  Interventions No flowsheet data found.

## 2021-01-08 NOTE — Progress Notes (Addendum)
° ° °  Subjective: 3 Days Post-Op Procedure(s) (LRB): Removal deep implants right lateral malleolus; tibio-talar calcaneal nailing from a lateral approach (Right) Patient reports pain as mild.   Doing well overall.  Objective: Vital signs in last 24 hours: Temp:  [98.2 F (36.8 C)-99.2 F (37.3 C)] 98.6 F (37 C) (12/25 0751) Pulse Rate:  [67-73] 70 (12/25 0751) Resp:  [16-18] 16 (12/25 0751) BP: (115-129)/(43-58) 117/43 (12/25 0751) SpO2:  [85 %-98 %] 98 % (12/25 0751)  Intake/Output from previous day: 12/24 0701 - 12/25 0700 In: 850 [P.O.:850] Out: 2450 [Urine:2450] Intake/Output this shift: Total I/O In: 240 [P.O.:240] Out: 700 [Urine:700]  Labs: Recent Labs    01/06/21 0053 01/07/21 0045  HGB 10.9* 9.4*   Recent Labs    01/06/21 0053 01/07/21 0045 01/07/21 0940  WBC 13.4* 10.6*  --   RBC 3.81* 3.28* 3.32*  HCT 34.9* 30.0*  --   PLT 225 190  --    Recent Labs    01/06/21 0053 01/07/21 0045  NA 133* 137  K 5.2* 4.6  CL 104 105  CO2 23 24  BUN 24* 23*  CREATININE 1.13* 1.03*  GLUCOSE 294* 181*  CALCIUM 8.3* 8.3*   No results for input(s): LABPT, INR in the last 72 hours.  Physical Exam: RLE Short leg splint in place Wiggles toes CR<2s ABD soft Sensation intact distally No cellulitis present Compartment soft Body mass index is 50.4 kg/m.   Assessment/Plan: 3 Days Post-Op Procedure(s) (LRB): Removal deep implants right lateral malleolus; tibio-talar calcaneal nailing from a lateral approach (Right) Advance diet Up with therapy Discharge to SNF when bed is ready No new recommendations   Netta Cedars, MD [facility] 01/08/2021, 9:56 AM

## 2021-01-08 NOTE — Progress Notes (Signed)
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  Hospitalist service consulted for medical management including poorly controlled diabetes.  Hyperglycemia improved.  Subjective: Patient doing okay, mild pain right foot.  Objective: Vitals:   01/07/21 1547 01/07/21 1940 01/08/21 0423 01/08/21 0751  BP: (!) 129/58 (!) 115/53 (!) 128/58 (!) 117/43  Pulse: 67 73 72 70  Resp: 16 17 18 16   Temp: 99.1 F (37.3 C) 99.2 F (37.3 C) 98.2 F (36.8 C) 98.6 F (37 C)  TempSrc: Oral Oral Oral Oral  SpO2: 93% 93% (!) 85% 98%  Weight:      Height:        Examination: General: Alert and oriented x3, no acute distress Cardiovascular: Regular rate and rhythm, S1-S2 Lungs: Clear to auscultation bilaterally Abdomen: Soft, nontender, nondistended, positive bowel sounds  Procedures:  01/05/2021-removal of deep implants of right lateral malleolus, tibiotalar calcaneal nailing   Assessment & Plan: Uncontrolled DM-2 with hyperglycemia and neuropathy: A1c 7.3%.  On Tresiba 44 units daily at home Recent Labs  Lab 01/07/21 0806 01/07/21 1123 01/07/21 1630 01/07/21 1936 01/08/21 0747  GLUCAP 114* 126* 120* 166* 126*   -Continue basal insulin 44 units daily -Continue SSI-resistant -Continue NovoLog 6 units 3 times daily with meals -Started atorvastatin. -Continue home gabapentin CBGs continue to remain stable, staying below 200 over the last 24 hours.  Status post ankle arthrodesis -Management per orthopedics/primary  Normocytic  anemia: Slight drop in Hgb likely surgical blood loss and dilutional from IV fluid.  Anemia panel with low ferritin suggesting some degree of iron deficiency.  Hemoglobin slightly increased today. Recent Labs    01/04/21 0945 01/06/21 0053 01/07/21 0045  HGB 12.0 10.9* 9.4*   -IV ferric gluconate 250 mg x 1 -P.o. ferrous sulfate on discharge   Anxiety/depression: Stable -Continue Paxil, hydroxyzine  Essential hypertension: BP improved. -Continue Coreg and losartan  Hyponatremia: Likely due to hyperglycemia.  Resolved.  Hyperkalemia: Resolved.  Leukocytosis/bandemia: Likely stress demargination and improved on own.   Morbid obesity complicated by diabetes Body mass index is 50.4 kg/m.  -Encourage lifestyle change to lose weight.  -Outpatient PCP/bariatric medicine/bariatric surgery f/u encouraged     DVT prophylaxis:  SCDs Start: 01/05/21 1234  Code Status: Full code Family Communication: Left message for family Level of care: Med-Surg Status is: Inpatient  Final disposition: SNF per primary.  Patient can be discharged on current diabetic and antihypertensive regimen from medical standpoint    Sch Meds:  Scheduled Meds:  atorvastatin  20 mg Oral Daily   carvedilol  12.5 mg Oral BID WC   docusate sodium  100 mg Oral BID   ferrous sulfate  325 mg Oral BID WC   gabapentin  900 mg Oral QHS   hydrOXYzine  50 mg Oral QHS   insulin aspart  0-20 Units Subcutaneous TID WC   insulin aspart  0-5 Units Subcutaneous QHS   insulin aspart  6 Units Subcutaneous TID WC   insulin glargine-yfgn  44 Units Subcutaneous Daily   losartan  25 mg Oral Daily   oxybutynin  5 mg Oral  BID   PARoxetine  20 mg Oral Daily   senna  1 tablet Oral BID   Continuous Infusions:  methocarbamol (ROBAXIN) IV     PRN Meds:.acetaminophen, diphenhydrAMINE, HYDROmorphone (DILAUDID) injection, methocarbamol **OR** methocarbamol (ROBAXIN) IV, ondansetron **OR** ondansetron (ZOFRAN) IV, oxyCODONE,  oxyCODONE  Antimicrobials: Anti-infectives (From admission, onward)    Start     Dose/Rate Route Frequency Ordered Stop   01/05/21 0955  vancomycin (VANCOCIN) powder  Status:  Discontinued          As needed 01/05/21 0956 01/05/21 1026   01/05/21 0600  ceFAZolin (ANCEF) IVPB 3g/100 mL premix        3 g 200 mL/hr over 30 Minutes Intravenous On call to O.R. 01/05/21 0549 01/05/21 0739        I have personally reviewed the following labs and images: CBC: Recent Labs  Lab 01/04/21 0945 01/06/21 0053 01/07/21 0045  WBC 10.0 13.4* 10.6*  NEUTROABS  --  12.1*  --   HGB 12.0 10.9* 9.4*  HCT 38.9 34.9* 30.0*  MCV 92.4 91.6 91.5  PLT 240 225 190    BMP &GFR Recent Labs  Lab 01/04/21 0945 01/06/21 0053 01/07/21 0045  NA 136 133* 137  K 4.4 5.2* 4.6  CL 103 104 105  CO2 27 23 24   GLUCOSE 173* 294* 181*  BUN 20 24* 23*  CREATININE 1.02* 1.13* 1.03*  CALCIUM 8.9 8.3* 8.3*  MG  --   --  2.1  PHOS  --   --  3.7    Estimated Creatinine Clearance: 88.2 mL/min (A) (by C-G formula based on SCr of 1.03 mg/dL (H)). Liver & Pancreas: Recent Labs  Lab 01/07/21 0045  ALBUMIN 2.4*    No results for input(s): LIPASE, AMYLASE in the last 168 hours. No results for input(s): AMMONIA in the last 168 hours. Diabetic: No results for input(s): HGBA1C in the last 72 hours.  Recent Labs  Lab 01/07/21 0806 01/07/21 1123 01/07/21 1630 01/07/21 1936 01/08/21 0747  GLUCAP 114* 126* 120* 166* 126*    Cardiac Enzymes: No results for input(s): CKTOTAL, CKMB, CKMBINDEX, TROPONINI in the last 168 hours. No results for input(s): PROBNP in the last 8760 hours. Coagulation Profile: No results for input(s): INR, PROTIME in the last 168 hours. Thyroid Function Tests: No results for input(s): TSH, T4TOTAL, FREET4, T3FREE, THYROIDAB in the last 72 hours. Lipid Profile: No results for input(s): CHOL, HDL, LDLCALC, TRIG, CHOLHDL, LDLDIRECT in the last 72 hours. Anemia Panel: Recent Labs     01/07/21 0940  VITAMINB12 579  FOLATE 7.1  FERRITIN 25  TIBC 308  IRON 50  RETICCTPCT 2.8    Urine analysis:    Component Value Date/Time   COLORURINE YELLOW 08/18/2015 0305   APPEARANCEUR CLEAR 08/18/2015 0305   LABSPEC 1.010 08/18/2015 0305   PHURINE 7.0 08/18/2015 0305   GLUCOSEU 100 (A) 08/18/2015 0305   HGBUR SMALL (A) 08/18/2015 0305   BILIRUBINUR NEGATIVE 08/18/2015 0305   KETONESUR NEGATIVE 08/18/2015 0305   PROTEINUR 30 (A) 08/18/2015 0305   NITRITE NEGATIVE 08/18/2015 0305   LEUKOCYTESUR NEGATIVE 08/18/2015 0305   Sepsis Labs: Invalid input(s): PROCALCITONIN, LACTICIDVEN  Microbiology: Recent Results (from the past 240 hour(s))  SARS CORONAVIRUS 2 (TAT 6-24 HRS) Nasopharyngeal Nasopharyngeal Swab     Status: None   Collection Time: 01/04/21  9:45 AM   Specimen: Nasopharyngeal Swab  Result Value Ref Range Status   SARS Coronavirus 2 NEGATIVE NEGATIVE Final    Comment: (NOTE) SARS-CoV-2 target  nucleic acids are NOT DETECTED.  The SARS-CoV-2 RNA is generally detectable in upper and lower respiratory specimens during the acute phase of infection. Negative results do not preclude SARS-CoV-2 infection, do not rule out co-infections with other pathogens, and should not be used as the sole basis for treatment or other patient management decisions. Negative results must be combined with clinical observations, patient history, and epidemiological information. The expected result is Negative.  Fact Sheet for Patients: HairSlick.no  Fact Sheet for Healthcare Providers: quierodirigir.com  This test is not yet approved or cleared by the [place] FDA and  has been authorized for detection and/or diagnosis of SARS-CoV-2 by FDA under an Emergency Use Authorization (EUA). This EUA will remain  in effect (meaning this test can be used) for the duration of the COVID-19 declaration under Se ction 564(b)(1) of  the Act, 21 U.S.C. section 360bbb-3(b)(1), unless the authorization is terminated or revoked sooner.  Performed at [facility], [address] [place], [place] [address]   Surgical pcr screen     Status: Abnormal   Collection Time: 01/04/21 10:09 AM   Specimen: Nasal Mucosa; Nasal Swab  Result Value Ref Range Status   MRSA, PCR NEGATIVE NEGATIVE Final   Staphylococcus aureus POSITIVE (A) NEGATIVE Final    Comment: (NOTE) The Xpert SA Assay (FDA approved for NASAL specimens in patients 1 years of age and older), is one component of a comprehensive surveillance program. It is not intended to diagnose infection nor to guide or monitor treatment. Performed at [facility], [address], [place], [place] [address]     Radiology Studies: No results found.  Virginia Rochester MD [facility]  If 7PM-7AM, please contact night-coverage [email or web address] 01/08/2021, 10:18 AM

## 2021-01-09 ENCOUNTER — Encounter (HOSPITAL_COMMUNITY): Payer: Self-pay | Admitting: Orthopedic Surgery

## 2021-01-09 DIAGNOSIS — F418 Other specified anxiety disorders: Secondary | ICD-10-CM | POA: Diagnosis not present

## 2021-01-09 DIAGNOSIS — E162 Hypoglycemia, unspecified: Secondary | ICD-10-CM | POA: Diagnosis not present

## 2021-01-09 DIAGNOSIS — Z981 Arthrodesis status: Secondary | ICD-10-CM | POA: Diagnosis not present

## 2021-01-09 DIAGNOSIS — S8262XA Displaced fracture of lateral malleolus of left fibula, initial encounter for closed fracture: Secondary | ICD-10-CM | POA: Diagnosis not present

## 2021-01-09 LAB — GLUCOSE, CAPILLARY
Glucose-Capillary: 151 mg/dL — ABNORMAL HIGH (ref 70–99)
Glucose-Capillary: 120 mg/dL — ABNORMAL HIGH (ref 70–99)
Glucose-Capillary: 50 mg/dL — ABNORMAL LOW (ref 70–99)
Glucose-Capillary: 158 mg/dL — ABNORMAL HIGH (ref 70–99)
Glucose-Capillary: 44 mg/dL — CL (ref 70–99)
Glucose-Capillary: 126 mg/dL — ABNORMAL HIGH (ref 70–99)
Glucose-Capillary: 167 mg/dL — ABNORMAL HIGH (ref 70–99)

## 2021-01-09 MED ORDER — INSULIN GLARGINE-YFGN 100 UNIT/ML ~~LOC~~ SOLN
40.0000 [IU] | Freq: Every day | SUBCUTANEOUS | Status: DC
  Administered 2021-01-10 – 2021-01-11 (×2): 40 [IU] via SUBCUTANEOUS
  Filled 2021-01-09 (×3): qty 0.4

## 2021-01-09 MED ORDER — INSULIN ASPART 100 UNIT/ML IJ SOLN
5.0000 [IU] | Freq: Three times a day (TID) | INTRAMUSCULAR | Status: DC
  Administered 2021-01-09 – 2021-01-11 (×4): 5 [IU] via SUBCUTANEOUS

## 2021-01-09 MED ORDER — DEXTROSE 50 % IV SOLN
INTRAVENOUS | Status: AC
  Administered 2021-01-09: 12:00:00 50 mL
  Filled 2021-01-09: qty 50

## 2021-01-09 NOTE — Progress Notes (Signed)
Subjective: 4 Days Post-Op Procedure(s) (LRB): Removal deep implants right lateral malleolus; tibio-talar calcaneal nailing from a lateral approach (Right) Patient seen in rounds for Dr. Victorino Dike Patient reports pain as mild. Well controlled on Pain meds No complaints overnight Waiting on SNF placement  Objective: Vital signs in last 24 hours: Temp:  [98.6 F (37 C)-100.1 F (37.8 C)] 100.1 F (37.8 C) (12/25 2004) Pulse Rate:  [70-81] 81 (12/25 2004) Resp:  [16] 16 (12/25 1538) BP: (105-129)/(43-57) 105/48 (12/25 2004) SpO2:  [91 %-98 %] 91 % (12/25 2004)  Intake/Output from previous day: 12/25 0701 - 12/26 0700 In: 460 [P.O.:460] Out: 2400 [Urine:2400] Intake/Output this shift: No intake/output data recorded.  Recent Labs    01/07/21 0045 01/08/21 1305  HGB 9.4* 10.5*   Recent Labs    01/07/21 0045 01/07/21 0940 01/08/21 1305  WBC 10.6*  --  11.3*  RBC 3.28* 3.32* 3.73*  HCT 30.0*  --  35.2*  PLT 190  --  224   Recent Labs    01/07/21 0045 01/08/21 1305  NA 137 139  K 4.6 4.2  CL 105 104  CO2 24 27  BUN 23* 22*  CREATININE 1.03* 1.28*  GLUCOSE 181* 99  CALCIUM 8.3* 8.8*   No results for input(s): LABPT, INR in the last 72 hours.  Neurologically intact Neurovascular intact Sensation intact distally Intact pulses distally Dorsiflexion/Plantar flexion intact Incision: dressing C/D/I Compartment soft   Assessment/Plan: 4 Days Post-Op Procedure(s) (LRB): Removal deep implants right lateral malleolus; tibio-talar calcaneal nailing from a lateral approach (Right) Advance diet Up with therapy NWB Discharge to SNF, waiting for a bed to become available.    Cherie Dark, PA-Cj [facility] [phone] 01/09/2021, 7:24 AM

## 2021-01-09 NOTE — Progress Notes (Signed)
D50 W administered for a hypoglycemic episode of 50 which was unresponsive to orange juice, infact cbg went down to  44 after the OJ. CBG 158 after the above intervention

## 2021-01-09 NOTE — Progress Notes (Signed)
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  Hospitalist service consulted for medical management including poorly controlled diabetes.  Hyperglycemia improved although this morning had an episode of hypoglycemia before lunch.  Subjective: Patient doing okay, tired  Objective: Vitals:   01/08/21 0751 01/08/21 1538 01/08/21 2004 01/09/21 0750  BP: (!) 117/43 (!) 129/57 (!) 105/48 118/62  Pulse: 70 79 81 79  Resp: 16 16  17   Temp: 98.6 F (37 C) 99.9 F (37.7 C) 100.1 F (37.8 C) 100.2 F (37.9 C)  TempSrc: Oral Oral Oral Oral  SpO2: 98% 93% 91% 91%  Weight:      Height:        Examination: General: Alert and oriented x3, no acute distress Cardiovascular: Regular rate and rhythm, S1-S2 Lungs: Clear to auscultation bilaterally Abdomen: Soft, nontender, nondistended, positive bowel sounds  Procedures:  01/05/2021-removal of deep implants of right lateral malleolus, tibiotalar calcaneal nailing   Assessment & Plan: Uncontrolled DM-2 with hyperglycemia and neuropathy: A1c 7.3%.  On Tresiba 44 units daily at home.  Episode of hypoglycemia this morning Recent Labs  Lab 01/09/21 0354 01/09/21 0753 01/09/21 1118 01/09/21 1144 01/09/21 1226  GLUCAP 151* 120* 50* 44* 158*   -Continue decrease basal insulin to 40 units daily -Continue SSI-resistant -Change 3 times daily NovoLog from 6 units down to 5 units with meals -Started atorvastatin. -Continue home gabapentin CBGs continue to remain stable, staying below 200  over the last 24 hours.  Status post ankle arthrodesis -Management per orthopedics/primary  Normocytic anemia: Slight drop in Hgb likely surgical blood loss and dilutional from IV fluid.  Anemia panel with low ferritin suggesting some degree of iron deficiency.  Hemoglobin slightly increased today. Recent Labs    01/04/21 0945 01/06/21 0053 01/07/21 0045 01/08/21 1305  HGB 12.0 10.9* 9.4* 10.5*   -IV ferric gluconate 250 mg x 1 -P.o. ferrous sulfate on discharge   Anxiety/depression: Stable -Continue Paxil, hydroxyzine  Essential hypertension: BP improved. -Continue Coreg and losartan  Hyponatremia: Likely due to hyperglycemia.  Resolved.  Hyperkalemia: Resolved.  Leukocytosis/bandemia: Likely stress demargination and improved on own.   Morbid obesity complicated by diabetes Body mass index is 50.4 kg/m.  -Encourage lifestyle change to lose weight.  -Outpatient PCP/bariatric medicine/bariatric surgery f/u encouraged     DVT prophylaxis:  SCDs Start: 01/05/21 1234  Code Status: Full code Family Communication: Left message for family Level of care: Med-Surg Status is: Inpatient  Final disposition: SNF per primary.  Patient can be discharged on current diabetic and antihypertensive regimen from medical standpoint    Sch Meds:  Scheduled Meds:  atorvastatin  20 mg Oral Daily   carvedilol  12.5 mg Oral BID WC   docusate sodium  100 mg Oral BID   ferrous sulfate  325 mg Oral BID WC   gabapentin  900 mg Oral QHS   hydrOXYzine  50 mg Oral QHS   insulin aspart  0-20 Units Subcutaneous TID WC   insulin aspart  0-5 Units Subcutaneous QHS   insulin aspart  5 Units Subcutaneous TID WC   [START  ON 01/10/2021] insulin glargine-yfgn  40 Units Subcutaneous Daily   losartan  25 mg Oral Daily   oxybutynin  5 mg Oral BID   PARoxetine  20 mg Oral Daily   senna  1 tablet Oral BID   Continuous Infusions:  methocarbamol (ROBAXIN) IV     PRN Meds:.acetaminophen,  diphenhydrAMINE, HYDROmorphone (DILAUDID) injection, methocarbamol **OR** methocarbamol (ROBAXIN) IV, ondansetron **OR** ondansetron (ZOFRAN) IV, oxyCODONE, oxyCODONE  Antimicrobials: Anti-infectives (From admission, onward)    Start     Dose/Rate Route Frequency Ordered Stop   01/05/21 0955  vancomycin (VANCOCIN) powder  Status:  Discontinued          As needed 01/05/21 0956 01/05/21 1026   01/05/21 0600  ceFAZolin (ANCEF) IVPB 3g/100 mL premix        3 g 200 mL/hr over 30 Minutes Intravenous On call to O.R. 01/05/21 0549 01/05/21 0739        I have personally reviewed the following labs and images: CBC: Recent Labs  Lab 01/04/21 0945 01/06/21 0053 01/07/21 0045 01/08/21 1305  WBC 10.0 13.4* 10.6* 11.3*  NEUTROABS  --  12.1*  --   --   HGB 12.0 10.9* 9.4* 10.5*  HCT 38.9 34.9* 30.0* 35.2*  MCV 92.4 91.6 91.5 94.4  PLT 240 225 190 224    BMP &GFR Recent Labs  Lab 01/04/21 0945 01/06/21 0053 01/07/21 0045 01/08/21 1305  NA 136 133* 137 139  K 4.4 5.2* 4.6 4.2  CL 103 104 105 104  CO2 27 23 24 27   GLUCOSE 173* 294* 181* 99  BUN 20 24* 23* 22*  CREATININE 1.02* 1.13* 1.03* 1.28*  CALCIUM 8.9 8.3* 8.3* 8.8*  MG  --   --  2.1 2.0  PHOS  --   --  3.7 5.2*    Estimated Creatinine Clearance: 70.9 mL/min (A) (by C-G formula based on SCr of 1.28 mg/dL (H)). Liver & Pancreas: Recent Labs  Lab 01/07/21 0045 01/08/21 1305  ALBUMIN 2.4* 2.8*    No results for input(s): LIPASE, AMYLASE in the last 168 hours. No results for input(s): AMMONIA in the last 168 hours. Diabetic: No results for input(s): HGBA1C in the last 72 hours.  Recent Labs  Lab 01/09/21 0354 01/09/21 0753 01/09/21 1118 01/09/21 1144 01/09/21 1226  GLUCAP 151* 120* 50* 44* 158*    Cardiac Enzymes: No results for input(s): CKTOTAL, CKMB, CKMBINDEX, TROPONINI in the last 168 hours. No results for input(s): PROBNP in the last 8760 hours. Coagulation Profile: No results for input(s): INR,  PROTIME in the last 168 hours. Thyroid Function Tests: No results for input(s): TSH, T4TOTAL, FREET4, T3FREE, THYROIDAB in the last 72 hours. Lipid Profile: No results for input(s): CHOL, HDL, LDLCALC, TRIG, CHOLHDL, LDLDIRECT in the last 72 hours. Anemia Panel: Recent Labs    01/07/21 0940  VITAMINB12 579  FOLATE 7.1  FERRITIN 25  TIBC 308  IRON 50  RETICCTPCT 2.8    Urine analysis:    Component Value Date/Time   COLORURINE YELLOW 08/18/2015 0305   APPEARANCEUR CLEAR 08/18/2015 0305   LABSPEC 1.010 08/18/2015 0305   PHURINE 7.0 08/18/2015 0305   GLUCOSEU 100 (A) 08/18/2015 0305   HGBUR SMALL (A) 08/18/2015 0305   BILIRUBINUR NEGATIVE 08/18/2015 0305   KETONESUR NEGATIVE 08/18/2015 0305   PROTEINUR 30 (A) 08/18/2015 0305   NITRITE NEGATIVE 08/18/2015 0305   LEUKOCYTESUR NEGATIVE 08/18/2015 0305   Sepsis Labs: Invalid input(s): PROCALCITONIN, LACTICIDVEN  Microbiology: Recent Results (from the past 240 hour(s))  SARS CORONAVIRUS 2 (TAT 6-24 HRS) Nasopharyngeal Nasopharyngeal Swab     Status: None   Collection Time: 01/04/21  9:45 AM   Specimen: Nasopharyngeal Swab  Result Value Ref Range Status   SARS Coronavirus 2 NEGATIVE NEGATIVE Final    Comment: (NOTE) SARS-CoV-2 target nucleic acids are NOT DETECTED.  The SARS-CoV-2 RNA is generally detectable in upper and lower respiratory specimens during the acute phase of infection. Negative results do not preclude SARS-CoV-2 infection, do not rule out co-infections with other pathogens, and should not be used as the sole basis for treatment or other patient management decisions. Negative results must be combined with clinical observations, patient history, and epidemiological information. The expected result is Negative.  Fact Sheet for Patients: HairSlick.no  Fact Sheet for Healthcare Providers: quierodirigir.com  This test is not yet approved or cleared by the  [place] FDA and  has been authorized for detection and/or diagnosis of SARS-CoV-2 by FDA under an Emergency Use Authorization (EUA). This EUA will remain  in effect (meaning this test can be used) for the duration of the COVID-19 declaration under Se ction 564(b)(1) of the Act, 21 U.S.C. section 360bbb-3(b)(1), unless the authorization is terminated or revoked sooner.  Performed at [facility], [address], [place], [place] [address]   Surgical pcr screen     Status: Abnormal   Collection Time: 01/04/21 10:09 AM   Specimen: Nasal Mucosa; Nasal Swab  Result Value Ref Range Status   MRSA, PCR NEGATIVE NEGATIVE Final   Staphylococcus aureus POSITIVE (A) NEGATIVE Final    Comment: (NOTE) The Xpert SA Assay (FDA approved for NASAL specimens in patients 98 years of age and older), is one component of a comprehensive surveillance program. It is not intended to diagnose infection nor to guide or monitor treatment. Performed at [facility], [address] [place], [place] [address]     Radiology Studies: No results found.  Virginia Rochester MD [facility]  If 7PM-7AM, please contact night-coverage [email or web address] 01/09/2021, 2:04 PM

## 2021-01-09 NOTE — Progress Notes (Signed)
Physical Therapy Treatment Patient Details Name: Cindy Ayers MRN: [id number] DOB: July 22, 1960 Today's Date: 01/09/2021   History of Present Illness Patient is a 60 y/o female who presents with right lateral malleolus nonunion, hardware failure and post traumatic arthritis now s/p removal of deep implant and tibio-talar calcaneal nailing 01/05/21. PMH includes DM-2, HTN, morbid obesity, anxiety, depression and fibromyalgia    PT Comments    Pt seen for mobility progression. She was able to tolerate OOB transfer today with use of RW and min A x2. She was able to maintain NWB'ing R LE throughout with occasional cueing. Pt would continue to benefit from skilled physical therapy services at this time while admitted and after d/c to address the below listed limitations in order to improve overall safety and independence with functional mobility.    Recommendations for follow up therapy are one component of a multi-disciplinary discharge planning process, led by the attending physician.  Recommendations may be updated based on patient status, additional functional criteria and insurance authorization.  Follow Up Recommendations  Skilled nursing-short term rehab (<3 hours/day)     Assistance Recommended at Discharge Frequent or constant Supervision/Assistance  Equipment Recommendations  Rolling walker (2 wheels);Wheelchair (measurements PT);BSC/3in1;Other (comment) (drop arm)    Recommendations for Other Services       Precautions / Restrictions Precautions Precautions: Fall Restrictions Weight Bearing Restrictions: Yes RLE Weight Bearing: Non weight bearing     Mobility  Bed Mobility Overal bed mobility: Needs Assistance Bed Mobility: Supine to Sit     Supine to sit: Modified independent (Device/Increase time);HOB elevated     General bed mobility comments: No assist needed, use of rail    Transfers Overall transfer level: Needs assistance Equipment used: Rolling walker (2  wheels) Transfers: Sit to/from Stand;Bed to chair/wheelchair/BSC Sit to Stand: Min assist;+2 safety/equipment;+2 physical assistance;From elevated surface Stand pivot transfers: Min assist;+2 safety/equipment;+2 physical assistance         General transfer comment: bed in an elevated position, cueing for safe hand placement with use of RW, min A x2 overall for safety and stability with transitional movement    Ambulation/Gait                   Stairs             Wheelchair Mobility    Modified Rankin (Stroke Patients Only)       Balance Overall balance assessment: Needs assistance Sitting-balance support: Feet supported;No upper extremity supported Sitting balance-Leahy Scale: Good     Standing balance support: Bilateral upper extremity supported;Single extremity supported Standing balance-Leahy Scale: Poor                              Cognition Arousal/Alertness: Awake/alert Behavior During Therapy: WFL for tasks assessed/performed Overall Cognitive Status: Within Functional Limits for tasks assessed                                          Exercises      General Comments        Pertinent Vitals/Pain Pain Assessment: Faces Faces Pain Scale: Hurts even more Pain Location: L upper abdomen Pain Descriptors / Indicators: Discomfort;Grimacing;Guarding Pain Intervention(s): Monitored during session    Home Living  Prior Function            PT Goals (current goals can now be found in the care plan section) Acute Rehab PT Goals PT Goal Formulation: With patient Time For Goal Achievement: 01/20/21 Potential to Achieve Goals: Good Progress towards PT goals: Progressing toward goals    Frequency    Min 3X/week      PT Plan Current plan remains appropriate    Co-evaluation              AM-PAC PT "6 Clicks" Mobility   Outcome Measure  Help needed turning from your  back to your side while in a flat bed without using bedrails?: None Help needed moving from lying on your back to sitting on the side of a flat bed without using bedrails?: None Help needed moving to and from a bed to a chair (including a wheelchair)?: A Lot Help needed standing up from a chair using your arms (e.g., wheelchair or bedside chair)?: A Lot Help needed to walk in hospital room?: Total Help needed climbing 3-5 steps with a railing? : Total 6 Click Score: 14    End of Session Equipment Utilized During Treatment: Gait belt Activity Tolerance: Patient tolerated treatment well Patient left: in chair;with call bell/phone within reach;with chair alarm set Nurse Communication: Mobility status;Need for lift equipment PT Visit Diagnosis: Difficulty in walking, not elsewhere classified (R26.2);Unsteadiness on feet (R26.81)     Time: 3567-0141 PT Time Calculation (min) (ACUTE ONLY): 18 min  Charges:  $Therapeutic Activity: 8-22 mins                     Arletta Bale, DPT  Acute Rehabilitation Services Office [phone]    [name] [name] 01/09/2021, 12:20 PM

## 2021-01-10 ENCOUNTER — Observation Stay (HOSPITAL_COMMUNITY): Payer: BC Managed Care – PPO

## 2021-01-10 DIAGNOSIS — D72829 Elevated white blood cell count, unspecified: Secondary | ICD-10-CM | POA: Diagnosis not present

## 2021-01-10 DIAGNOSIS — I10 Essential (primary) hypertension: Secondary | ICD-10-CM | POA: Diagnosis not present

## 2021-01-10 DIAGNOSIS — S8262XA Displaced fracture of lateral malleolus of left fibula, initial encounter for closed fracture: Secondary | ICD-10-CM | POA: Diagnosis not present

## 2021-01-10 DIAGNOSIS — Z981 Arthrodesis status: Secondary | ICD-10-CM | POA: Diagnosis not present

## 2021-01-10 LAB — URINALYSIS, ROUTINE W REFLEX MICROSCOPIC
Bilirubin Urine: NEGATIVE
Glucose, UA: NEGATIVE mg/dL
Ketones, ur: NEGATIVE mg/dL
Nitrite: NEGATIVE
Protein, ur: NEGATIVE mg/dL
Specific Gravity, Urine: 1.014 (ref 1.005–1.030)
pH: 5 (ref 5.0–8.0)

## 2021-01-10 LAB — GLUCOSE, CAPILLARY
Glucose-Capillary: 124 mg/dL — ABNORMAL HIGH (ref 70–99)
Glucose-Capillary: 93 mg/dL (ref 70–99)
Glucose-Capillary: 126 mg/dL — ABNORMAL HIGH (ref 70–99)
Glucose-Capillary: 113 mg/dL — ABNORMAL HIGH (ref 70–99)

## 2021-01-10 LAB — CBC
HCT: 29.7 % — ABNORMAL LOW (ref 36.0–46.0)
Hemoglobin: 9.3 g/dL — ABNORMAL LOW (ref 12.0–15.0)
MCH: 29.2 pg (ref 26.0–34.0)
MCHC: 31.3 g/dL (ref 30.0–36.0)
MCV: 93.4 fL (ref 80.0–100.0)
Platelets: 195 10*3/uL (ref 150–400)
RBC: 3.18 MIL/uL — ABNORMAL LOW (ref 3.87–5.11)
RDW: 14.9 % (ref 11.5–15.5)
WBC: 13 10*3/uL — ABNORMAL HIGH (ref 4.0–10.5)
nRBC: 0.4 % — ABNORMAL HIGH (ref 0.0–0.2)

## 2021-01-10 LAB — PROCALCITONIN: Procalcitonin: <0.1 ng/mL

## 2021-01-10 NOTE — Progress Notes (Signed)
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  Hospitalist service consulted for medical management including poorly controlled diabetes.   No further hypoglycemic events after episode on 12/25.    Subjective: Patient with no complaints.  Denies any shortness of breath.  Does complain of some mild itching treated with Benadryl.  Objective: Vitals:   01/09/21 1459 01/09/21 2131 01/10/21 0628 01/10/21 0700  BP: (!) 117/54 (!) 166/68 (!) 109/55 112/78  Pulse: 74 92 74 78  Resp: 17 18  18   Temp: 99.1 F (37.3 C) 100.2 F (37.9 C) 99.5 F (37.5 C) 98.6 F (37 C)  TempSrc: Oral Oral Oral Oral  SpO2: 96% 94% 94% 93%  Weight:      Height:        Examination: General: Alert and oriented x3, no acute distress Cardiovascular: Regular rate and rhythm, S1-S2 Lungs: Clear to auscultation bilaterally Abdomen: Soft, nontender, nondistended, positive bowel sounds  Procedures:  01/05/2021-removal of deep implants of right lateral malleolus, tibiotalar calcaneal nailing   Assessment & Plan: Uncontrolled DM-2 with hyperglycemia and neuropathy: A1c 7.3%.  On Tresiba 44 units daily at home.  Episode of hypoglycemia this morning Recent Labs  Lab 01/09/21 1226 01/09/21 1531 01/09/21 2133 01/10/21 0728 01/10/21 1210  GLUCAP 158* 126* 167* 124* 93   -Continue decrease basal insulin to 40 units daily -Continue SSI-resistant -Change 3 times daily NovoLog from 6 units down to 5 units with meals -Started atorvastatin. -Continue home  gabapentin CBGs continue to remain stable, staying below 200 over the last 24 hours.  Status post ankle arthrodesis -Management per orthopedics/primary  Normocytic anemia: Slight drop in Hgb likely surgical blood loss and dilutional from IV fluid.  Anemia panel with low ferritin suggesting some degree of iron deficiency.  Hemoglobin slightly increased today. Recent Labs    01/04/21 0945 01/06/21 0053 01/07/21 0045 01/08/21 1305 01/10/21 0151  HGB 12.0 10.9* 9.4* 10.5* 9.3*   -IV ferric gluconate 250 mg x 1 -P.o. ferrous sulfate on discharge   Anxiety/depression: Stable -Continue Paxil, hydroxyzine  Essential hypertension: BP improved. -Continue Coreg and losartan  Hyponatremia: Likely due to hyperglycemia.  Resolved.  Hyperkalemia: Resolved.  Leukocytosis/bandemia: White blood cell count slightly increased.  Procalcitonin level checked and normal.  Chest x-ray unequivocal.  Waiting for urinalysis.   Morbid obesity complicated by diabetes Body mass index is 50.4 kg/m.  -Encourage lifestyle change to lose weight.  -Outpatient PCP/bariatric medicine/bariatric surgery f/u encouraged     DVT prophylaxis:  SCDs Start: 01/05/21 1234  Code Status: Full code Level of care: Med-Surg Status is: Inpatient  Final disposition: SNF per primary.  Patient can be discharged on current diabetic and antihypertensive regimen from medical standpoint    Sch Meds:  Scheduled Meds:  atorvastatin  20 mg Oral Daily   carvedilol  12.5 mg Oral BID WC   docusate sodium  100 mg Oral BID   ferrous sulfate  325 mg Oral BID WC   gabapentin  900 mg Oral QHS   hydrOXYzine  50 mg Oral QHS   insulin aspart  0-20 Units  Subcutaneous TID WC   insulin aspart  0-5 Units Subcutaneous QHS   insulin aspart  5 Units Subcutaneous TID WC   insulin glargine-yfgn  40 Units Subcutaneous Daily   losartan  25 mg Oral Daily   oxybutynin  5 mg Oral BID   PARoxetine  20 mg Oral Daily   senna  1 tablet Oral  BID   Continuous Infusions:  methocarbamol (ROBAXIN) IV     PRN Meds:.acetaminophen, diphenhydrAMINE, HYDROmorphone (DILAUDID) injection, methocarbamol **OR** methocarbamol (ROBAXIN) IV, ondansetron **OR** ondansetron (ZOFRAN) IV, oxyCODONE, oxyCODONE  Antimicrobials: Anti-infectives (From admission, onward)    Start     Dose/Rate Route Frequency Ordered Stop   01/05/21 0955  vancomycin (VANCOCIN) powder  Status:  Discontinued          As needed 01/05/21 0956 01/05/21 1026   01/05/21 0600  ceFAZolin (ANCEF) IVPB 3g/100 mL premix        3 g 200 mL/hr over 30 Minutes Intravenous On call to O.R. 01/05/21 0549 01/05/21 0739        I have personally reviewed the following labs and images: CBC: Recent Labs  Lab 01/04/21 0945 01/06/21 0053 01/07/21 0045 01/08/21 1305 01/10/21 0151  WBC 10.0 13.4* 10.6* 11.3* 13.0*  NEUTROABS  --  12.1*  --   --   --   HGB 12.0 10.9* 9.4* 10.5* 9.3*  HCT 38.9 34.9* 30.0* 35.2* 29.7*  MCV 92.4 91.6 91.5 94.4 93.4  PLT 240 225 190 224 195    BMP &GFR Recent Labs  Lab 01/04/21 0945 01/06/21 0053 01/07/21 0045 01/08/21 1305  NA 136 133* 137 139  K 4.4 5.2* 4.6 4.2  CL 103 104 105 104  CO2 27 23 24 27   GLUCOSE 173* 294* 181* 99  BUN 20 24* 23* 22*  CREATININE 1.02* 1.13* 1.03* 1.28*  CALCIUM 8.9 8.3* 8.3* 8.8*  MG  --   --  2.1 2.0  PHOS  --   --  3.7 5.2*    Estimated Creatinine Clearance: 70.9 mL/min (A) (by C-G formula based on SCr of 1.28 mg/dL (H)). Liver & Pancreas: Recent Labs  Lab 01/07/21 0045 01/08/21 1305  ALBUMIN 2.4* 2.8*    No results for input(s): LIPASE, AMYLASE in the last 168 hours. No results for input(s): AMMONIA in the last 168 hours. Diabetic: No results for input(s): HGBA1C in the last 72 hours.  Recent Labs  Lab 01/09/21 1226 01/09/21 1531 01/09/21 2133 01/10/21 0728 01/10/21 1210  GLUCAP 158* 126* 167* 124* 93    Cardiac Enzymes: No results for input(s): CKTOTAL, CKMB, CKMBINDEX, TROPONINI  in the last 168 hours. No results for input(s): PROBNP in the last 8760 hours. Coagulation Profile: No results for input(s): INR, PROTIME in the last 168 hours. Thyroid Function Tests: No results for input(s): TSH, T4TOTAL, FREET4, T3FREE, THYROIDAB in the last 72 hours. Lipid Profile: No results for input(s): CHOL, HDL, LDLCALC, TRIG, CHOLHDL, LDLDIRECT in the last 72 hours. Anemia Panel: No results for input(s): VITAMINB12, FOLATE, FERRITIN, TIBC, IRON, RETICCTPCT in the last 72 hours.  Urine analysis:    Component Value Date/Time   COLORURINE YELLOW 08/18/2015 0305   APPEARANCEUR CLEAR 08/18/2015 0305   LABSPEC 1.010 08/18/2015 0305   PHURINE 7.0 08/18/2015 0305   GLUCOSEU 100 (A) 08/18/2015 0305   HGBUR SMALL (A) 08/18/2015 0305   BILIRUBINUR NEGATIVE 08/18/2015 0305   KETONESUR NEGATIVE 08/18/2015 0305   PROTEINUR 30 (A) 08/18/2015 0305   NITRITE NEGATIVE 08/18/2015 0305   LEUKOCYTESUR NEGATIVE  08/18/2015 0305   Sepsis Labs: Invalid input(s): PROCALCITONIN, LACTICIDVEN  Microbiology: Recent Results (from the past 240 hour(s))  SARS CORONAVIRUS 2 (TAT 6-24 HRS) Nasopharyngeal Nasopharyngeal Swab     Status: None   Collection Time: 01/04/21  9:45 AM   Specimen: Nasopharyngeal Swab  Result Value Ref Range Status   SARS Coronavirus 2 NEGATIVE NEGATIVE Final    Comment: (NOTE) SARS-CoV-2 target nucleic acids are NOT DETECTED.  The SARS-CoV-2 RNA is generally detectable in upper and lower respiratory specimens during the acute phase of infection. Negative results do not preclude SARS-CoV-2 infection, do not rule out co-infections with other pathogens, and should not be used as the sole basis for treatment or other patient management decisions. Negative results must be combined with clinical observations, patient history, and epidemiological information. The expected result is Negative.  Fact Sheet for Patients: HairSlick.no  Fact Sheet  for Healthcare Providers: quierodirigir.com  This test is not yet approved or cleared by the [place] FDA and  has been authorized for detection and/or diagnosis of SARS-CoV-2 by FDA under an Emergency Use Authorization (EUA). This EUA will remain  in effect (meaning this test can be used) for the duration of the COVID-19 declaration under Se ction 564(b)(1) of the Act, 21 U.S.C. section 360bbb-3(b)(1), unless the authorization is terminated or revoked sooner.  Performed at [facility], [address] [place], [place] [address]   Surgical pcr screen     Status: Abnormal   Collection Time: 01/04/21 10:09 AM   Specimen: Nasal Mucosa; Nasal Swab  Result Value Ref Range Status   MRSA, PCR NEGATIVE NEGATIVE Final   Staphylococcus aureus POSITIVE (A) NEGATIVE Final    Comment: (NOTE) The Xpert SA Assay (FDA approved for NASAL specimens in patients 42 years of age and older), is one component of a comprehensive surveillance program. It is not intended to diagnose infection nor to guide or monitor treatment. Performed at [facility], [address], [place], [place] [address]     Radiology Studies: DG CHEST PORT 1 VIEW  Result Date: 01/10/2021 CLINICAL DATA:  Chest pain EXAM: PORTABLE CHEST 1 VIEW COMPARISON:  08/18/2015 FINDINGS: Transverse diameter of heart is slightly increased. There are no signs of alveolar pulmonary edema. Right hemidiaphragm is elevated. Increased markings are seen in the right lower lung fields. There is no focal pulmonary consolidation. There is no pleural effusion or pneumothorax. IMPRESSION: Increased markings are seen in the right lower lung fields which may suggest crowding of bronchovascular structures due to elevation of right hemidiaphragm or interstitial pneumonitis. There is no focal pulmonary consolidation. There is no pleural effusion or pneumothorax. Electronically Signed   By: Ernie Avena M.D.   On:  01/10/2021 10:00    Virginia Rochester MD [facility]  If 7PM-7AM, please contact night-coverage [email or web address] 01/10/2021, 2:49 PM

## 2021-01-10 NOTE — TOC Progression Note (Signed)
Transition of Care [facility]) - Progression Note    Patient Details  Name: Cindy Ayers MRN: [id number] Date of Birth: [birth year]  Transition of Care [facility]) CM/SW Contact  Jimmy Picket, [place] Phone Number: 01/10/2021, 3:35 PM  Clinical Narrative:     CSW spoke to pt at bedside and gave only bed offer. Pt stated she will accept. CSW contacted admission coordinator who stated they can take pt at DC. Facility will start insurance auth. CSW uploaded Passr clinicals in hub.   Expected Discharge Plan: Skilled Nursing Facility Barriers to Discharge: English as a second language teacher, Continued Medical Work up, SNF Pending bed offer  Expected Discharge Plan and Services Expected Discharge Plan: Skilled Nursing Facility       Living arrangements for the past 2 months: Mobile Home                                       Social Determinants of Health (SDOH) Interventions    Readmission Risk Interventions No flowsheet data found. Jimmy Picket, LCSW Clinical Social Worker

## 2021-01-10 NOTE — Progress Notes (Signed)
This nurse and Chancie, NT made multiple attempts to get patient OOB and patient refused, stating "I'll do it tomorrow, I don't feel like doing it today."

## 2021-01-10 NOTE — Progress Notes (Signed)
Subjective: 5 Days Post-Op Procedure(s) (LRB): Removal deep implants right lateral malleolus; tibio-talar calcaneal nailing from a lateral approach (Right)  Patient reports pain as mild to moderate.  Tolerating POs well. Admits to flatus.  Denies fever, chills, N/V CP, SOB.  Objective:   VITALS:  Temp:  [98.6 F (37 C)-100.2 F (37.9 C)] 98.6 F (37 C) (12/27 0700) Pulse Rate:  [74-92] 78 (12/27 0700) Resp:  [18] 18 (12/27 0700) BP: (109-166)/(55-78) 112/78 (12/27 0700) SpO2:  [93 %-94 %] 93 % (12/27 0700)  General: WDWN patient in NAD. Psych:  Appropriate mood and affect. Neuro:  A&O x 3, Moving all extremities, sensation intact to light touch HEENT:  EOMs intact Chest:  Even non-labored respirations Skin:  SLS C/D/I, no rashes or lesions Extremities: warm/dry, no visible edema, erythema or echymosis.  No lymphadenopathy. Pulses: Popliteus 2+ MSK:  ROM: EHL/FHL intact, MMT: able to perform quad set   LABS Recent Labs    01/08/21 1305 01/10/21 0151  HGB 10.5* 9.3*  WBC 11.3* 13.0*  PLT 224 195   Recent Labs    01/08/21 1305  NA 139  K 4.2  CL 104  CO2 27  BUN 22*  CREATININE 1.28*  GLUCOSE 99   No results for input(s): LABPT, INR in the last 72 hours.   Assessment/Plan: 5 Days Post-Op Procedure(s) (LRB): Removal deep implants right lateral malleolus; tibio-talar calcaneal nailing from a lateral approach (Right)  NWB R LE Up with therapy Disp:  SNF, awaiting bed placement. D/C scripts on chart. Please inform me when patient has a bed available and I will place D/C orders. Plan for outpatient post-op visit.  Alfredo Martinez PA-C [facility] Office:  [phone]

## 2021-01-10 NOTE — Progress Notes (Signed)
°  Mobility Specialist Criteria Algorithm Info.   01/10/21 1615  Mobility  Activity Refused mobility (Pt declined for unspecified reasons)    01/10/2021 4:19 PM

## 2021-01-11 DIAGNOSIS — S8262XA Displaced fracture of lateral malleolus of left fibula, initial encounter for closed fracture: Secondary | ICD-10-CM | POA: Diagnosis not present

## 2021-01-11 LAB — CBC
HCT: 29.7 % — ABNORMAL LOW (ref 36.0–46.0)
Hemoglobin: 9.2 g/dL — ABNORMAL LOW (ref 12.0–15.0)
MCH: 28.8 pg (ref 26.0–34.0)
MCHC: 31 g/dL (ref 30.0–36.0)
MCV: 92.8 fL (ref 80.0–100.0)
Platelets: 218 10*3/uL (ref 150–400)
RBC: 3.2 MIL/uL — ABNORMAL LOW (ref 3.87–5.11)
RDW: 14.6 % (ref 11.5–15.5)
WBC: 12.8 10*3/uL — ABNORMAL HIGH (ref 4.0–10.5)
nRBC: 0.2 % (ref 0.0–0.2)

## 2021-01-11 LAB — SARS CORONAVIRUS 2 (TAT 6-24 HRS): SARS Coronavirus 2: NEGATIVE

## 2021-01-11 LAB — GLUCOSE, CAPILLARY
Glucose-Capillary: 113 mg/dL — ABNORMAL HIGH (ref 70–99)
Glucose-Capillary: 108 mg/dL — ABNORMAL HIGH (ref 70–99)

## 2021-01-11 NOTE — Progress Notes (Signed)
PROGRESS NOTE  Cindy Ayers HYW:[id number] DOB: 1960-07-20   PCP: [facility]  Patient is from: Home  DOA: 01/05/2021 LOS: 1  Brief Narrative / Interim history: 60 year old F with past medical history of diabetes mellitus type 2, hypertension morbid obesity, anxiety, depression and fibromyalgia admitted by orthopedic surgery for right fibular nonunion on 12/22 and underwent ORIF with operative removal of deep implants from the right fibula and ankle arthrodesis that same day.  Hospitalist service consulted for medical management including poorly controlled diabetes.   No further hypoglycemic events after episode on 12/25.    Subjective: Patient with no complaints.  Happy to go to skilled nursing today.  Objective: Vitals:   01/10/21 2032 01/10/21 2032 01/11/21 0321 01/11/21 0737  BP: (!) 163/80 (!) 163/80 128/66 (!) 107/59  Pulse: 75 75 72 75  Resp: 18 18  18   Temp: 100.1 F (37.8 C) 100.1 F (37.8 C) 99.4 F (37.4 C) 99.3 F (37.4 C)  TempSrc: Oral Oral Oral Oral  SpO2: 96% 96% 91% 92%  Weight:      Height:        Examination: General: Alert and oriented x3, no acute distress Cardiovascular: Regular rate and rhythm, S1-S2 Lungs: Clear to auscultation bilaterally Abdomen: Soft, nontender, nondistended, positive bowel sounds  Procedures:  01/05/2021-removal of deep implants of right lateral malleolus, tibiotalar calcaneal nailing   Assessment & Plan: Uncontrolled DM-2 with hyperglycemia and neuropathy: A1c 7.3%.  On Tresiba 44 units daily at home.  Episode of hypoglycemia this morning Recent Labs  Lab 01/10/21 1210 01/10/21 1726 01/10/21 2054 01/11/21 0712 01/11/21 1153  GLUCAP 93 126* 113* 113* 108*   -Continue decrease basal insulin to 40 units daily -Continue SSI-resistant -Change 3 times daily NovoLog from 6 units down to 5 units with meals -Started atorvastatin. -Continue home gabapentin CBGs continue to remain stable,  staying below 200 over the last 48 hours  Status post ankle arthrodesis -Management per orthopedics/primary  Normocytic anemia: Slight drop in Hgb likely surgical blood loss and dilutional from IV fluid.  Anemia panel with low ferritin suggesting some degree of iron deficiency.  Hemoglobin slightly increased today. Recent Labs    01/04/21 0945 01/06/21 0053 01/07/21 0045 01/08/21 1305 01/10/21 0151 01/11/21 0156  HGB 12.0 10.9* 9.4* 10.5* 9.3* 9.2*   -IV ferric gluconate 250 mg x 1 -P.o. ferrous sulfate on discharge   Anxiety/depression: Stable -Continue Paxil, hydroxyzine  Essential hypertension: BP improved. -Continue Coreg and losartan  Hyponatremia: Likely due to hyperglycemia.  Resolved.  Hyperkalemia: Resolved.  Leukocytosis/bandemia: White blood cell count minimally elevated although no further increase from previous day.  Procalcitonin level checked and normal.  Chest x-ray noting some crowding, likely from body habitus, but no evidence of infection.  Urinalysis did note many bacteria, but only 6-10 white cells and nitrite and leukocyte negative.  Would not consider this urinary infection.  Patient remains afebrile.  Okay to go to nursing.   Morbid obesity complicated by diabetes Body mass index is 50.4 kg/m.  -Encourage lifestyle change to lose weight.  -Outpatient PCP/bariatric medicine/bariatric surgery f/u encouraged     DVT prophylaxis:  SCDs Start: 01/05/21 1234  Code Status: Full code Level of care: Med-Surg Status is: Inpatient  Final disposition: SNF per primary.  Patient can be discharged on current diabetic and antihypertensive regimen from medical standpoint    Sch Meds:  Scheduled Meds:  atorvastatin  20 mg Oral Daily   carvedilol  12.5 mg Oral BID WC  docusate sodium  100 mg Oral BID   ferrous sulfate  325 mg Oral BID WC   gabapentin  900 mg Oral QHS   hydrOXYzine  50 mg Oral QHS   insulin aspart  0-20 Units Subcutaneous TID WC    insulin aspart  0-5 Units Subcutaneous QHS   insulin aspart  5 Units Subcutaneous TID WC   insulin glargine-yfgn  40 Units Subcutaneous Daily   losartan  25 mg Oral Daily   oxybutynin  5 mg Oral BID   PARoxetine  20 mg Oral Daily   senna  1 tablet Oral BID   Continuous Infusions:  methocarbamol (ROBAXIN) IV     PRN Meds:.acetaminophen, diphenhydrAMINE, HYDROmorphone (DILAUDID) injection, methocarbamol **OR** methocarbamol (ROBAXIN) IV, ondansetron **OR** ondansetron (ZOFRAN) IV, oxyCODONE, oxyCODONE  Antimicrobials: Anti-infectives (From admission, onward)    Start     Dose/Rate Route Frequency Ordered Stop   01/05/21 0955  vancomycin (VANCOCIN) powder  Status:  Discontinued          As needed 01/05/21 0956 01/05/21 1026   01/05/21 0600  ceFAZolin (ANCEF) IVPB 3g/100 mL premix        3 g 200 mL/hr over 30 Minutes Intravenous On call to O.R. 01/05/21 0549 01/05/21 0739        I have personally reviewed the following labs and images: CBC: Recent Labs  Lab 01/06/21 0053 01/07/21 0045 01/08/21 1305 01/10/21 0151 01/11/21 0156  WBC 13.4* 10.6* 11.3* 13.0* 12.8*  NEUTROABS 12.1*  --   --   --   --   HGB 10.9* 9.4* 10.5* 9.3* 9.2*  HCT 34.9* 30.0* 35.2* 29.7* 29.7*  MCV 91.6 91.5 94.4 93.4 92.8  PLT 225 190 224 195 218    BMP &GFR Recent Labs  Lab 01/06/21 0053 01/07/21 0045 01/08/21 1305  NA 133* 137 139  K 5.2* 4.6 4.2  CL 104 105 104  CO2 23 24 27   GLUCOSE 294* 181* 99  BUN 24* 23* 22*  CREATININE 1.13* 1.03* 1.28*  CALCIUM 8.3* 8.3* 8.8*  MG  --  2.1 2.0  PHOS  --  3.7 5.2*    Estimated Creatinine Clearance: 70.9 mL/min (A) (by C-G formula based on SCr of 1.28 mg/dL (H)). Liver & Pancreas: Recent Labs  Lab 01/07/21 0045 01/08/21 1305  ALBUMIN 2.4* 2.8*    No results for input(s): LIPASE, AMYLASE in the last 168 hours. No results for input(s): AMMONIA in the last 168 hours. Diabetic: No results for input(s): HGBA1C in the last 72 hours.  Recent  Labs  Lab 01/10/21 1210 01/10/21 1726 01/10/21 2054 01/11/21 0712 01/11/21 1153  GLUCAP 93 126* 113* 113* 108*    Cardiac Enzymes: No results for input(s): CKTOTAL, CKMB, CKMBINDEX, TROPONINI in the last 168 hours. No results for input(s): PROBNP in the last 8760 hours. Coagulation Profile: No results for input(s): INR, PROTIME in the last 168 hours. Thyroid Function Tests: No results for input(s): TSH, T4TOTAL, FREET4, T3FREE, THYROIDAB in the last 72 hours. Lipid Profile: No results for input(s): CHOL, HDL, LDLCALC, TRIG, CHOLHDL, LDLDIRECT in the last 72 hours. Anemia Panel: No results for input(s): VITAMINB12, FOLATE, FERRITIN, TIBC, IRON, RETICCTPCT in the last 72 hours.  Urine analysis:    Component Value Date/Time   COLORURINE YELLOW 01/10/2021 1445   APPEARANCEUR CLOUDY (A) 01/10/2021 1445   LABSPEC 1.014 01/10/2021 1445   PHURINE 5.0 01/10/2021 1445   GLUCOSEU NEGATIVE 01/10/2021 1445   HGBUR SMALL (A) 01/10/2021 1445   BILIRUBINUR NEGATIVE 01/10/2021  1445   KETONESUR NEGATIVE 01/10/2021 1445   PROTEINUR NEGATIVE 01/10/2021 1445   NITRITE NEGATIVE 01/10/2021 1445   LEUKOCYTESUR SMALL (A) 01/10/2021 1445   Sepsis Labs: Invalid input(s): PROCALCITONIN, LACTICIDVEN  Microbiology: Recent Results (from the past 240 hour(s))  SARS CORONAVIRUS 2 (TAT 6-24 HRS) Nasopharyngeal Nasopharyngeal Swab     Status: None   Collection Time: 01/04/21  9:45 AM   Specimen: Nasopharyngeal Swab  Result Value Ref Range Status   SARS Coronavirus 2 NEGATIVE NEGATIVE Final    Comment: (NOTE) SARS-CoV-2 target nucleic acids are NOT DETECTED.  The SARS-CoV-2 RNA is generally detectable in upper and lower respiratory specimens during the acute phase of infection. Negative results do not preclude SARS-CoV-2 infection, do not rule out co-infections with other pathogens, and should not be used as the sole basis for treatment or other patient management decisions. Negative results must  be combined with clinical observations, patient history, and epidemiological information. The expected result is Negative.  Fact Sheet for Patients: HairSlick.no  Fact Sheet for Healthcare Providers: quierodirigir.com  This test is not yet approved or cleared by the [place] FDA and  has been authorized for detection and/or diagnosis of SARS-CoV-2 by FDA under an Emergency Use Authorization (EUA). This EUA will remain  in effect (meaning this test can be used) for the duration of the COVID-19 declaration under Se ction 564(b)(1) of the Act, 21 U.S.C. section 360bbb-3(b)(1), unless the authorization is terminated or revoked sooner.  Performed at [facility], [address], [place], [place] [address]   Surgical pcr screen     Status: Abnormal   Collection Time: 01/04/21 10:09 AM   Specimen: Nasal Mucosa; Nasal Swab  Result Value Ref Range Status   MRSA, PCR NEGATIVE NEGATIVE Final   Staphylococcus aureus POSITIVE (A) NEGATIVE Final    Comment: (NOTE) The Xpert SA Assay (FDA approved for NASAL specimens in patients 35 years of age and older), is one component of a comprehensive surveillance program. It is not intended to diagnose infection nor to guide or monitor treatment. Performed at [facility], [address], [place], [place] [address]     Radiology Studies: No results found.  Virginia Rochester MD [facility]  If 7PM-7AM, please contact night-coverage [email or web address] 01/11/2021, 2:54 PM

## 2021-01-11 NOTE — Social Work (Addendum)
Pts passr number is [phone].  Jimmy Picket, LCSW Clinical Social Worker

## 2021-01-11 NOTE — Discharge Summary (Signed)
Physician Discharge Summary  Patient ID: Cindy Ayers MRN: [id number] DOB/AGE: 10/08/1960 60 y.o.  Admit date: 01/05/2021 Discharge date: 01/11/2021  Admission Diagnoses: Malunion R ankle fx; DM type II, morbid obesity, HTN, depression with anxiety, and hx of SOB.   Discharge Diagnoses:  Principal Problem:   Status post ankle arthrodesis Active Problems:   Diabetes mellitus without complication (HCC)   Hypertension   Morbid obesity with BMI of 50.0-59.9, adult (HCC)   Depression with anxiety Same as above  Discharged Condition: stable  Hospital Course: Patient presented to [facility] OR for elective Right tibiotalocalcaneal arthrodesis by Dr. Toni Arthurs on 01/05/21. The patient tolerated the procedure well without complication.  She was then admitted to the hospital.  Hospitalist team was consulted to help manage the patient's underlying commorbidities.  She worked well with therapy.  She tolerated her stay well without difficulty.  She is to be D/C'd to SNF on 01/11/21.  Consults:  Hospitalist service  Significant Diagnostic Studies: radiology: X-Ray: To ensure satisfactory anatomic alignment during operative procedure.  Treatments: IV hydration, antibiotics: Ancef, analgesia: acetaminophen, Dilaudid, and oxycodoen, insulin: novolog, cardiac meds: carvedilol and losartan, anticoagulation: lovenox, and surgery: As stated above  Discharge Exam: Blood pressure (!) 107/59, pulse 75, temperature 99.3 F (37.4 C), temperature source Oral, resp. rate 18, height 5' 6.5" (1.689 m), weight (!) 143.8 kg, SpO2 92 %. General: WDWN patient in NAD. Psych:  Appropriate mood and affect. Neuro:  A&O x 3, Moving all extremities, sensation intact to light touch HEENT:  EOMs intact Chest:  Even non-labored respirations Skin:  SLS C/D/I, no rashes or lesions Extremities: warm/dry, no visible edema, erythema or echymosis.  No lymphadenopathy. Pulses: Popliteus 2+ MSK:  ROM: EHL/FHL intact, MMT:  able to perform quad set   Disposition: Discharge disposition: 03-Skilled Nursing Facility       Discharge Instructions     Call MD / Call 911   Complete by: As directed    If you experience chest pain or shortness of breath, CALL 911 and be transported to the hospital emergency room.  If you develope a fever above 101 F, pus (white drainage) or increased drainage or redness at the wound, or calf pain, call your surgeon's office.   Constipation Prevention   Complete by: As directed    Drink plenty of fluids.  Prune juice may be helpful.  You may use a stool softener, such as Colace (over the counter) 100 mg twice a day.  Use MiraLax (over the counter) for constipation as needed.   Diet - low sodium heart healthy   Complete by: As directed    Increase activity slowly as tolerated   Complete by: As directed    Non weight bearing   Complete by: As directed    Laterality: right   Extremity: Lower   Post-operative opioid taper instructions:   Complete by: As directed    POST-OPERATIVE OPIOID TAPER INSTRUCTIONS: It is important to wean off of your opioid medication as soon as possible. If you do not need pain medication after your surgery it is ok to stop day one. Opioids include: Codeine, Hydrocodone(Norco, Vicodin), Oxycodone(Percocet, oxycontin) and hydromorphone amongst others.  Long term and even short term use of opiods can cause: Increased pain response Dependence Constipation Depression Respiratory depression And more.  Withdrawal symptoms can include Flu like symptoms Nausea, vomiting And more Techniques to manage these symptoms Hydrate well Eat regular healthy meals Stay active Use relaxation techniques(deep breathing, meditating, yoga) Do Not substitute  Alcohol to help with tapering If you have been on opioids for less than two weeks and do not have pain than it is ok to stop all together.  Plan to wean off of opioids This plan should start within one week post  op of your joint replacement. Maintain the same interval or time between taking each dose and first decrease the dose.  Cut the total daily intake of opioids by one tablet each day Next start to increase the time between doses. The last dose that should be eliminated is the evening dose.         Allergies as of 01/11/2021       Reactions   Ethylenediamine    Pt unaware of allergy    Antihistamines, Chlorpheniramine-type Palpitations        Medication List     STOP taking these medications    aspirin EC 81 MG tablet   traMADol 50 MG tablet Commonly known as: ULTRAM       TAKE these medications    carvedilol 12.5 MG tablet Commonly known as: COREG Take 12.5 mg by mouth 2 (two) times daily with a meal.   docusate sodium 100 MG capsule Commonly known as: Colace Take 1 capsule (100 mg total) by mouth 2 (two) times daily. While taking narcotic pain medicine.   gabapentin 300 MG capsule Commonly known as: NEURONTIN Take 900 mg by mouth at bedtime.   hydrOXYzine 50 MG capsule Commonly known as: VISTARIL Take 50 mg by mouth at bedtime.   losartan 25 MG tablet Commonly known as: COZAAR Take 25 mg by mouth daily.   meloxicam 15 MG tablet Commonly known as: MOBIC Take 15 mg by mouth daily.   oxybutynin 5 MG tablet Commonly known as: DITROPAN Take 5 mg by mouth 2 (two) times daily.   oxyCODONE 5 MG immediate release tablet Commonly known as: Roxicodone Take 1 tablet (5 mg total) by mouth every 4 (four) hours as needed for severe pain.   PARoxetine 20 MG tablet Commonly known as: PAXIL Take 20 mg by mouth daily.   rivaroxaban 10 MG Tabs tablet Commonly known as: Xarelto Take 1 tablet (10 mg total) by mouth daily.   senna 8.6 MG Tabs tablet Commonly known as: SENOKOT Take 2 tablets (17.2 mg total) by mouth 2 (two) times daily.   Evaristo Bury FlexTouch 100 UNIT/ML FlexTouch Pen Generic drug: insulin degludec Inject 44 Units into the skin daily.    triamcinolone cream 0.1 % Commonly known as: KENALOG Apply 1 application topically 2 (two) times daily as needed (eczema).   Vitamin D 50 MCG (2000 UT) tablet Take 2,000 Units by mouth daily.               Discharge Care Instructions  (From admission, onward)           Start     Ordered   01/11/21 0000  Non weight bearing       Question Answer Comment  Laterality right   Extremity Lower      01/11/21 1200            Follow-up Information     Toni Arthurs, MD. Schedule an appointment as soon as possible for a visit in 2 week(s).   Specialty: Orthopedic Surgery Contact information: [address] [place] [address] [phone]                 Signed: Lolly Mustache Office:  [phone]

## 2021-01-11 NOTE — TOC Transition Note (Signed)
Transition of Care [facility]) - CM/SW Discharge Note   Patient Details  Name: Cindy Ayers MRN: [id number] Date of Birth: 1960/02/20  [facility]) CM/SW Contact:  Jimmy Picket, LCSW Phone Number: 01/11/2021, 1:15 PM   Clinical Narrative:     Per MD patient ready for DC to [facility] rehab. RN, patient, patient's family, and facility notified of DC. Discharge Summary and FL2 sent to facility. DC packet on chart. Insurance Berk[employer] Harvey has been received and pt is covid negative. Ambulance transport requested for patient.    RN to call report to [phone].  CSW will sign off for now as social work intervention is no longer needed. Please consult [place] again if new needs arise.   Final next level of care: Skilled Nursing Facility Barriers to Discharge: Barriers Resolved   Patient Goals and CMS Choice Patient states their goals for this hospitalization and ongoing recovery are:: TO be able to return home CMS Medicare.gov Compare Post Acute Care list provided to:: Patient Choice offered to / list presented to : Patient  Discharge Placement              Patient chooses bed at: [facility] Patient to be transferred to facility by: family car Name of family member notified: pt oriented x4 Patient and family notified of of transfer: 01/11/21  Discharge Plan and Services                                     Social Determinants of Health (SDOH) Interventions     Readmission Risk Interventions No flowsheet data found.  Jimmy Picket, LCSW Clinical Social Worker

## 2021-01-11 NOTE — Plan of Care (Signed)
  Problem: Clinical Measurements: Goal: Will remain free from infection Outcome: Not Progressing   Problem: Activity: Goal: Risk for activity intolerance will decrease Outcome: Not Progressing   Problem: Elimination: Goal: Will not experience complications related to bowel motility Outcome: Not Progressing   Problem: Pain Managment: Goal: General experience of comfort will improve Outcome: Not Progressing   

## 2021-01-11 NOTE — Progress Notes (Signed)
Occupational Therapy Treatment Patient Details Name: Cindy Ayers MRN: [id number] DOB: 1960/06/08 Today's Date: 01/11/2021   History of present illness Patient is a 60 y/o female who presents with right lateral malleolus nonunion, hardware failure and post traumatic arthritis now s/p removal of deep implant and tibio-talar calcaneal nailing 01/05/21. PMH includes DM-2, HTN, morbid obesity, anxiety, depression and fibromyalgia   OT comments  Pt min guard -min A for ADLs this session, mod I for bed mobility, min-mod A for transfers. Attempted stand pivot transfer to chair, however pt displays great difficulty despite intense cuing for different techniques. Pt able to complete lateral scoot transfer to drop arm recliner with min guard, adhering to WB precautions. Pt limited by impairments listed below, will continue to follow acutely. Recommend SNF at d/c.   Recommendations for follow up therapy are one component of a multi-disciplinary discharge planning process, led by the attending physician.  Recommendations may be updated based on patient status, additional functional criteria and insurance authorization.    Follow Up Recommendations  Skilled nursing-short term rehab (<3 hours/day)    Assistance Recommended at Discharge Frequent or constant Supervision/Assistance  Equipment Recommendations  Other (comment);Wheelchair (measurements OT);Wheelchair cushion (measurements OT) (defer to next venue of care)    Recommendations for Other Services PT consult    Precautions / Restrictions Precautions Precautions: Fall Restrictions Weight Bearing Restrictions: Yes RLE Weight Bearing: Non weight bearing       Mobility Bed Mobility Overal bed mobility: Needs Assistance Bed Mobility: Supine to Sit Rolling: Modified independent (Device/Increase time)         General bed mobility comments: requires increased cuing to sit EOB    Transfers Overall transfer level: Needs  assistance Equipment used: Rolling walker (2 wheels) Transfers: Sit to/from Stand;Bed to chair/wheelchair/BSC Sit to Stand: Min assist;Mod assist Stand pivot transfers: Min assist;Mod assist        Lateral/Scoot Transfers: Min guard General transfer comment: pt benefits from [facility] elevated, unable to adhere to NWB precaution for ambulation or stand pivot transfer, opted for lateral scoot to recliner instead     Balance Overall balance assessment: Needs assistance Sitting-balance support: Feet supported;No upper extremity supported Sitting balance-Leahy Scale: Good     Standing balance support: Bilateral upper extremity supported;Single extremity supported Standing balance-Leahy Scale: Poor                             ADL either performed or assessed with clinical judgement   ADL Overall ADL's : Needs assistance/impaired Eating/Feeding: Independent;Sitting               Upper Body Dressing : Min guard;Sitting Upper Body Dressing Details (indicate cue type and reason): donned new gown sitting EOB     Toilet Transfer: Minimal assistance;Moderate assistance;Rolling walker (2 wheels);BSC/3in1;Cueing for safety;Stand-pivot;Requires wide/bariatric Toilet Transfer Details (indicate cue type and reason): simulated with lateral scoot to chair Toileting- Clothing Manipulation and Hygiene: Total assistance;Bed level       Functional mobility during ADLs: Minimal assistance      Extremity/Trunk Assessment Upper Extremity Assessment Upper Extremity Assessment: Generalized weakness   Lower Extremity Assessment Lower Extremity Assessment: Defer to PT evaluation        Vision       Perception     Praxis      Cognition Arousal/Alertness: Awake/alert Behavior During Therapy: WFL for tasks assessed/performed Overall Cognitive Status: Within Functional Limits for tasks assessed  Exercises      Shoulder Instructions       General Comments reviewed precautions with pt, pt verbalzied understanding however has difficulty adhering to them despite cues    Pertinent Vitals/ Pain       Pain Assessment: Faces Pain Score: 2  Faces Pain Scale: Hurts little more Pain Location: RLE Pain Descriptors / Indicators: Discomfort;Grimacing;Guarding Pain Intervention(s): Limited activity within patient's tolerance;Monitored during session  Home Living                                          Prior Functioning/Environment              Frequency  Min 2X/week        Progress Toward Goals  OT Goals(current goals can now be found in the care plan section)  Progress towards OT goals: Progressing toward goals  Acute Rehab OT Goals Patient Stated Goal: to go home OT Goal Formulation: With patient Time For Goal Achievement: 01/20/21 Potential to Achieve Goals: Good ADL Goals Pt Will Perform Grooming: with supervision;with set-up;sitting Pt Will Perform Upper Body Bathing: with supervision;with set-up;sitting Pt Will Perform Lower Body Bathing: with mod assist;sitting/lateral leans;with adaptive equipment Pt Will Perform Upper Body Dressing: with supervision;with set-up;sitting Pt Will Transfer to Toilet: with max assist;with mod assist;with +2 assist;bedside commode Pt Will Perform Toileting - Clothing Manipulation and hygiene: with max assist;with mod assist;sitting/lateral leans  Plan Discharge plan remains appropriate;Frequency remains appropriate    Co-evaluation                 AM-PAC OT "6 Clicks" Daily Activity     Outcome Measure   Help from another person eating meals?: None Help from another person taking care of personal grooming?: A Little Help from another person toileting, which includes using toliet, bedpan, or urinal?: Total Help from another person bathing (including washing, rinsing, drying)?: A Lot Help from another person to put  on and taking off regular upper body clothing?: A Little Help from another person to put on and taking off regular lower body clothing?: Total 6 Click Score: 14    End of Session Equipment Utilized During Treatment: Gait belt;Rolling walker (2 wheels)  OT Visit Diagnosis: Other abnormalities of gait and mobility (R26.89);History of falling (Z91.81);Muscle weakness (generalized) (M62.81)   Activity Tolerance Patient tolerated treatment well   Patient Left in chair;with chair alarm set;with nursing/sitter in room;with call bell/phone within reach   Nurse Communication Mobility status;Weight bearing status (difficulty maintaining NWB status)        Time: 7353-2992 OT Time Calculation (min): 21 min  Charges: OT General Charges $OT Visit: 1 Visit OT Treatments $Self Care/Home Management : 8-22 mins  Alfonzo Beers, OTD, OTR/L Acute Rehab [phone]) [phone]   Mayer Masker 01/11/2021, 2:36 PM

## 2022-09-08 IMAGING — DX DG CHEST 1V PORT
1 series · 1 of 1 positions shown · non-contrast
Comparison: 08/18/2015

CLINICAL DATA: Chest pain

EXAM:
PORTABLE CHEST 1 VIEW

[chest]
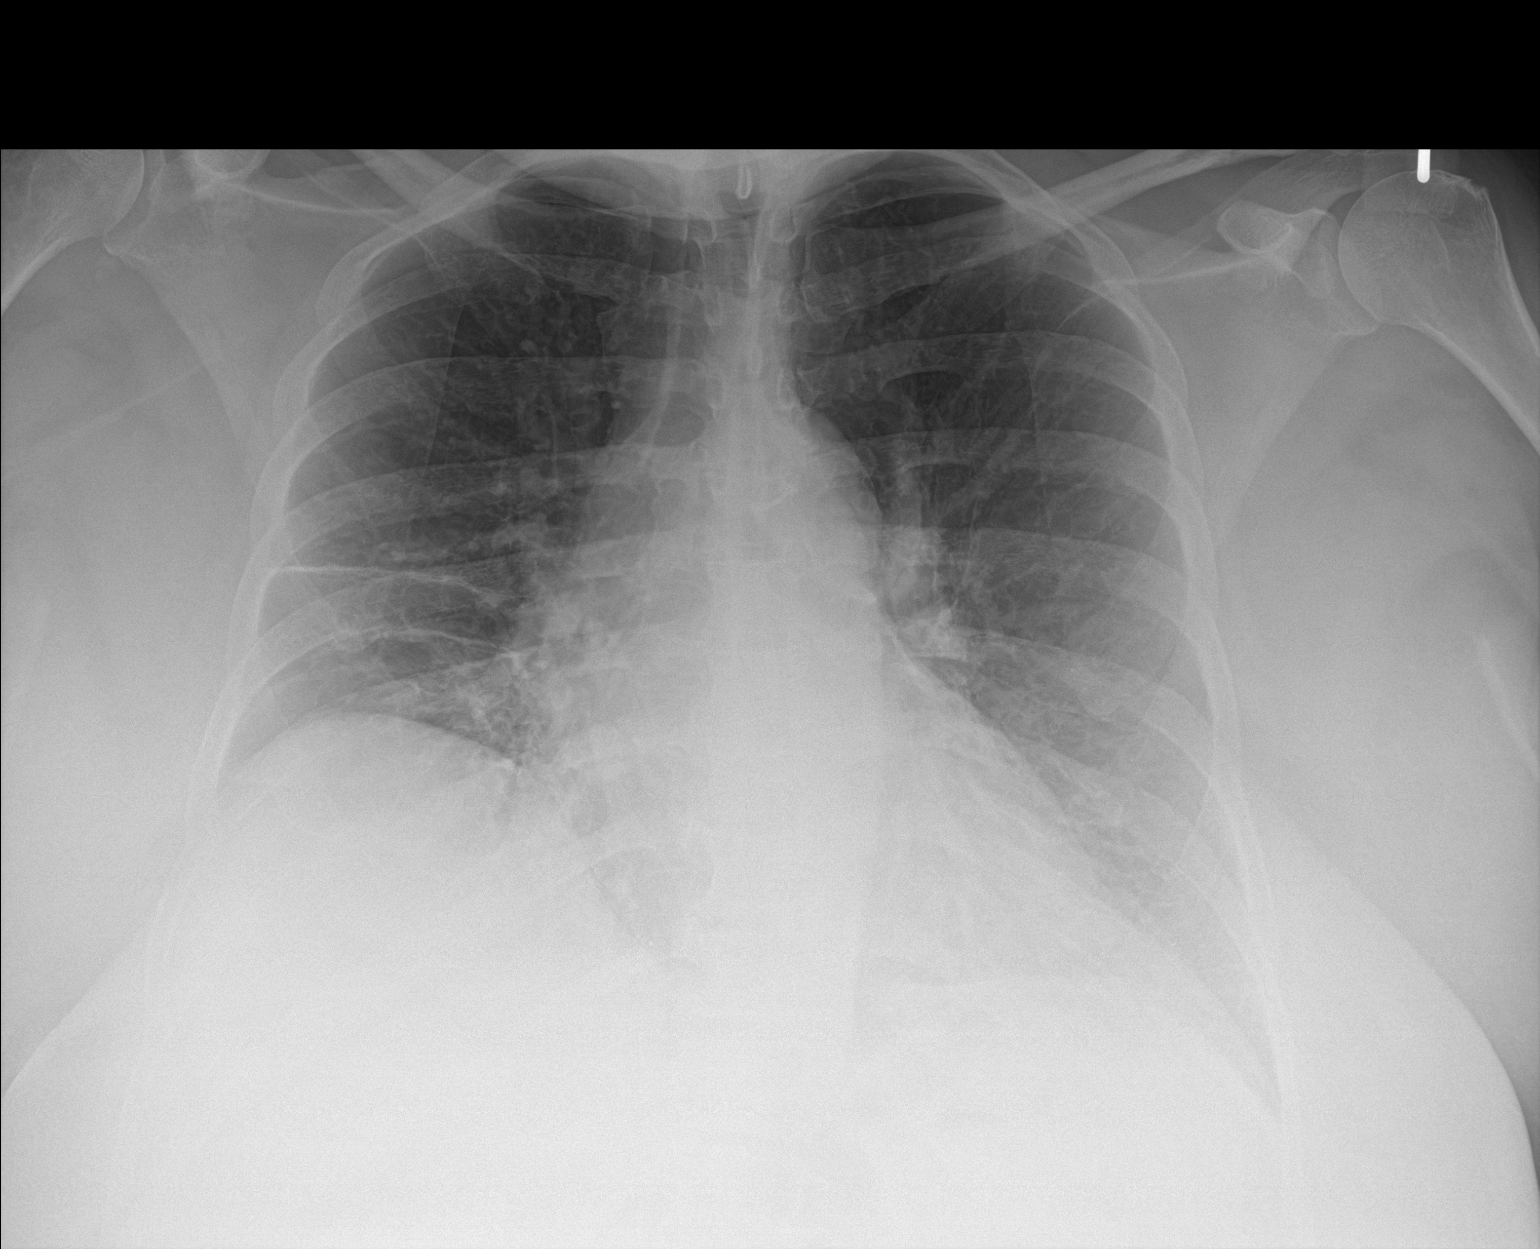

[1 of 1 positions shown; findings below may reference images not displayed]

FINDINGS: Transverse diameter of heart is slightly increased. There are no
signs of alveolar pulmonary edema. Right hemidiaphragm is elevated.
Increased markings are seen in the right lower lung fields. There is
no focal pulmonary consolidation. There is no pleural effusion or
pneumothorax.
IMPRESSION: Increased markings are seen in the right lower lung fields which may
suggest crowding of bronchovascular structures due to elevation of
right hemidiaphragm or interstitial pneumonitis. There is no focal
pulmonary consolidation. There is no pleural effusion or
pneumothorax.
